# Patient Record
Sex: Male | Born: 1975 | Race: White | Hispanic: No | Marital: Married | Smoking: Former smoker
Health system: Southern US, Community
[De-identification: ages and names within clinical notes are randomized; demographics above are authoritative.]

## PROBLEM LIST (undated history)

## (undated) DIAGNOSIS — S060X9A Concussion with loss of consciousness of unspecified duration, initial encounter: Secondary | ICD-10-CM

## (undated) DIAGNOSIS — S060XAA Concussion with loss of consciousness status unknown, initial encounter: Secondary | ICD-10-CM

## (undated) DIAGNOSIS — S069X9A Unspecified intracranial injury with loss of consciousness of unspecified duration, initial encounter: Secondary | ICD-10-CM

## (undated) DIAGNOSIS — M5126 Other intervertebral disc displacement, lumbar region: Secondary | ICD-10-CM

## (undated) DIAGNOSIS — S069XAA Unspecified intracranial injury with loss of consciousness status unknown, initial encounter: Secondary | ICD-10-CM

## (undated) DIAGNOSIS — F319 Bipolar disorder, unspecified: Secondary | ICD-10-CM

## (undated) DIAGNOSIS — F4310 Post-traumatic stress disorder, unspecified: Secondary | ICD-10-CM

## (undated) DIAGNOSIS — F509 Eating disorder, unspecified: Secondary | ICD-10-CM

## (undated) HISTORY — PX: OTHER SURGICAL HISTORY: SHX169

---

## 2014-09-17 ENCOUNTER — Emergency Department (HOSPITAL_BASED_OUTPATIENT_CLINIC_OR_DEPARTMENT_OTHER): Payer: BC Managed Care – PPO

## 2014-09-17 ENCOUNTER — Emergency Department (HOSPITAL_BASED_OUTPATIENT_CLINIC_OR_DEPARTMENT_OTHER)
Admission: EM | Admit: 2014-09-17 | Discharge: 2014-09-17 | Disposition: A | Discharge status: Home / Self Care | Payer: BC Managed Care – PPO | Attending: Emergency Medicine | Admitting: Emergency Medicine

## 2014-09-17 ENCOUNTER — Encounter (HOSPITAL_BASED_OUTPATIENT_CLINIC_OR_DEPARTMENT_OTHER): Payer: Self-pay

## 2014-09-17 DIAGNOSIS — K7010 Alcoholic hepatitis without ascites: Secondary | ICD-10-CM | POA: Insufficient documentation

## 2014-09-17 DIAGNOSIS — R21 Rash and other nonspecific skin eruption: Secondary | ICD-10-CM | POA: Diagnosis not present

## 2014-09-17 DIAGNOSIS — R740 Nonspecific elevation of levels of transaminase and lactic acid dehydrogenase [LDH]: Secondary | ICD-10-CM | POA: Insufficient documentation

## 2014-09-17 DIAGNOSIS — Z79899 Other long term (current) drug therapy: Secondary | ICD-10-CM | POA: Diagnosis not present

## 2014-09-17 DIAGNOSIS — Z880 Allergy status to penicillin: Secondary | ICD-10-CM | POA: Diagnosis not present

## 2014-09-17 DIAGNOSIS — Z87891 Personal history of nicotine dependence: Secondary | ICD-10-CM | POA: Diagnosis not present

## 2014-09-17 DIAGNOSIS — F319 Bipolar disorder, unspecified: Secondary | ICD-10-CM | POA: Insufficient documentation

## 2014-09-17 DIAGNOSIS — Z8739 Personal history of other diseases of the musculoskeletal system and connective tissue: Secondary | ICD-10-CM | POA: Insufficient documentation

## 2014-09-17 DIAGNOSIS — R1110 Vomiting, unspecified: Secondary | ICD-10-CM

## 2014-09-17 DIAGNOSIS — R197 Diarrhea, unspecified: Secondary | ICD-10-CM

## 2014-09-17 DIAGNOSIS — R7401 Elevation of levels of liver transaminase levels: Secondary | ICD-10-CM

## 2014-09-17 HISTORY — DX: Concussion with loss of consciousness status unknown, initial encounter: S06.0XAA

## 2014-09-17 HISTORY — DX: Post-traumatic stress disorder, unspecified: F43.10

## 2014-09-17 HISTORY — DX: Other intervertebral disc displacement, lumbar region: M51.26

## 2014-09-17 HISTORY — DX: Bipolar disorder, unspecified: F31.9

## 2014-09-17 HISTORY — DX: Concussion with loss of consciousness of unspecified duration, initial encounter: S06.0X9A

## 2014-09-17 HISTORY — DX: Eating disorder, unspecified: F50.9

## 2014-09-17 LAB — CBC WITH DIFFERENTIAL/PLATELET
Basophils Absolute: 0.1 10*3/uL (ref 0.0–0.1)
Basophils Relative: 1 % (ref 0–1)
Eosinophils Absolute: 0.1 10*3/uL (ref 0.0–0.7)
Eosinophils Relative: 1 % (ref 0–5)
HCT: 50.2 % (ref 39.0–52.0)
HEMOGLOBIN: 17.5 g/dL — AB (ref 13.0–17.0)
LYMPHS ABS: 1.1 10*3/uL (ref 0.7–4.0)
LYMPHS PCT: 22 % (ref 12–46)
MCH: 30.5 pg (ref 26.0–34.0)
MCHC: 34.9 g/dL (ref 30.0–36.0)
MCV: 87.6 fL (ref 78.0–100.0)
MONOS PCT: 16 % — AB (ref 3–12)
Monocytes Absolute: 0.8 10*3/uL (ref 0.1–1.0)
NEUTROS PCT: 60 % (ref 43–77)
Neutro Abs: 3.1 10*3/uL (ref 1.7–7.7)
PLATELETS: 238 10*3/uL (ref 150–400)
RBC: 5.73 MIL/uL (ref 4.22–5.81)
RDW: 12.7 % (ref 11.5–15.5)
WBC: 5.1 10*3/uL (ref 4.0–10.5)

## 2014-09-17 LAB — COMPREHENSIVE METABOLIC PANEL
ALK PHOS: 86 U/L (ref 39–117)
ALT: 348 U/L — ABNORMAL HIGH (ref 0–53)
ANION GAP: 16 — AB (ref 5–15)
AST: 513 U/L — ABNORMAL HIGH (ref 0–37)
Albumin: 4.3 g/dL (ref 3.5–5.2)
BUN: 16 mg/dL (ref 6–23)
CO2: 26 mEq/L (ref 19–32)
Calcium: 9.3 mg/dL (ref 8.4–10.5)
Chloride: 93 mEq/L — ABNORMAL LOW (ref 96–112)
Creatinine, Ser: 1 mg/dL (ref 0.50–1.35)
GFR calc non Af Amer: >90 mL/min (ref >90)
GLUCOSE: 104 mg/dL — AB (ref 70–99)
Potassium: 4.1 mEq/L (ref 3.7–5.3)
Sodium: 135 mEq/L — ABNORMAL LOW (ref 137–147)
Total Bilirubin: 0.7 mg/dL (ref 0.3–1.2)
Total Protein: 7.7 g/dL (ref 6.0–8.3)

## 2014-09-17 LAB — PROTIME-INR
INR: 1.06 (ref 0.00–1.49)
Prothrombin Time: 13.8 seconds (ref 11.6–15.2)

## 2014-09-17 MED ORDER — SODIUM CHLORIDE 0.9 % IV BOLUS (SEPSIS)
1000.0000 mL | Freq: Once | INTRAVENOUS | Status: AC
  Administered 2014-09-17: 1000 mL via INTRAVENOUS

## 2014-09-17 MED ORDER — ONDANSETRON HCL 4 MG PO TABS: 4.0000 mg | ORAL_TABLET | Freq: Four times a day (QID) | ORAL | Status: AC

## 2014-09-17 MED ORDER — DICYCLOMINE HCL 10 MG/ML IM SOLN
20.0000 mg | Freq: Once | INTRAMUSCULAR | Status: AC
  Administered 2014-09-17: 20 mg via INTRAMUSCULAR
  Filled 2014-09-17: qty 2

## 2014-09-17 MED ORDER — ONDANSETRON HCL 4 MG/2ML IJ SOLN
4.0000 mg | Freq: Once | INTRAMUSCULAR | Status: AC
  Administered 2014-09-17: 4 mg via INTRAVENOUS
  Filled 2014-09-17: qty 2

## 2014-09-17 NOTE — ED Provider Notes (Signed)
4:55 PM patient is able to drink without vomiting and feels ready to go home. He appears in no distress. He is instructed to get his blood pressure recheck in 3 weeks. Results for orders placed or performed during the hospital encounter of 09/17/14  CBC with Differential  Result Value Ref Range   WBC 5.1 4.0 - 10.5 K/uL   RBC 5.73 4.22 - 5.81 MIL/uL   Hemoglobin 17.5 (H) 13.0 - 17.0 g/dL   HCT 40.950.2 81.139.0 - 91.452.0 %   MCV 87.6 78.0 - 100.0 fL   MCH 30.5 26.0 - 34.0 pg   MCHC 34.9 30.0 - 36.0 g/dL   RDW 78.212.7 95.611.5 - 21.315.5 %   Platelets 238 150 - 400 K/uL   Neutrophils Relative % 60 43 - 77 %   Neutro Abs 3.1 1.7 - 7.7 K/uL   Lymphocytes Relative 22 12 - 46 %   Lymphs Abs 1.1 0.7 - 4.0 K/uL   Monocytes Relative 16 (H) 3 - 12 %   Monocytes Absolute 0.8 0.1 - 1.0 K/uL   Eosinophils Relative 1 0 - 5 %   Eosinophils Absolute 0.1 0.0 - 0.7 K/uL   Basophils Relative 1 0 - 1 %   Basophils Absolute 0.1 0.0 - 0.1 K/uL  Comprehensive metabolic panel  Result Value Ref Range   Sodium 135 (L) 137 - 147 mEq/L   Potassium 4.1 3.7 - 5.3 mEq/L   Chloride 93 (L) 96 - 112 mEq/L   CO2 26 19 - 32 mEq/L   Glucose, Bld 104 (H) 70 - 99 mg/dL   BUN 16 6 - 23 mg/dL   Creatinine, Ser 0.861.00 0.50 - 1.35 mg/dL   Calcium 9.3 8.4 - 57.810.5 mg/dL   Total Protein 7.7 6.0 - 8.3 g/dL   Albumin 4.3 3.5 - 5.2 g/dL   AST 469513 (H) 0 - 37 U/L   ALT 348 (H) 0 - 53 U/L   Alkaline Phosphatase 86 39 - 117 U/L   Total Bilirubin 0.7 0.3 - 1.2 mg/dL   GFR calc non Af Amer >90 >90 mL/min   GFR calc Af Amer >90 >90 mL/min   Anion gap 16 (H) 5 - 15  Protime-INR  Result Value Ref Range   Prothrombin Time 13.8 11.6 - 15.2 seconds   INR 1.06 0.00 - 1.49   [place] Abdomen Complete  09/17/2014   CLINICAL DATA:  Elevated LFTs, transaminitis, nausea, vomiting, diarrhea for 4 days.  EXAM: ULTRASOUND ABDOMEN COMPLETE  COMPARISON:  None.  FINDINGS: Gallbladder: No gallstones or wall thickening visualized. No sonographic Murphy sign noted.   Common bile duct: Diameter: 3 mm  Liver: Mild coarse increased echogenicity compatible with hepatic steatosis. No intrahepatic biliary dilatation or definite focal abnormality. Patent portal vein with normal hepatopetal flow.  IVC: Limited visualization.  No gross abnormality  Pancreas: Visualized portion unremarkable.  Spleen: Size and appearance within normal limits.  Right Kidney: Length: 12.6 cm. Echogenicity within normal limits. No mass or hydronephrosis visualized.  Left Kidney: Length: 12.6 cm. Echogenicity within normal limits. No mass or hydronephrosis visualized.  Abdominal aorta: No aneurysm visualized.  Other findings: None.  IMPRESSION: No acute finding by abdominal ultrasound.  Mild hepatic steatosis or fatty infiltration of the liver.   Electronically Signed   By: Ruel Favorsrevor  Shick M.D.   On: 09/17/2014 15:26     Larry SouSam Cort Dragoo, MD 09/17/14 1700

## 2014-09-17 NOTE — ED Provider Notes (Signed)
CSN: [id number]     Arrival date & time 09/17/14  1205 History   First MD Initiated Contact with Patient 09/17/14 1221     Chief Complaint  Patient presents with  . Vomiting     (Consider location/radiation/quality/duration/timing/severity/associated sxs/prior Treatment) HPI Comments: Patient reports nausea, vomiting, diarrhea. Symptoms have been present for the last 5 days. Patient having difficulty holding anything down. He is concerned because he has not been able to take his bipolar medicine because of the symptoms. Patient denies fever. He has not had any abdominal pain. No chest pain, cough, shortness of breath. Patient denies hematemesis, melena, rectal bleeding. He noticed a rash on his forehead today. It is not itchy or painful. No drainage. Patient does report that several family members have had mild GI symptoms this week.  Patient does have a history of alcoholism. He reports that he does not drink daily, but he does admit that several days ago he had an episode of binge drinking.   Past Medical History  Diagnosis Date  . Lumbar herniated disc   . Bipolar 1 disorder   . Eating disorder   . Concussion   . PTSD (post-traumatic stress disorder)    History reviewed. No pertinent past surgical history. No family history on file. History  Substance Use Topics  . Smoking status: Former Games developermoker  . Smokeless tobacco: Not on file  . Alcohol Use: Yes    Review of Systems  Gastrointestinal: Positive for nausea, vomiting and diarrhea. Negative for abdominal pain.  Skin: Positive for rash.  All other systems reviewed and are negative.     Allergies  Flumist and Penicillins  Home Medications   Prior to Admission medications   Medication Sig Start Date End Date Taking? Authorizing Provider  asenapine (SAPHRIS) 5 MG SUBL 24 hr tablet Place 5 mg under the tongue 2 (two) times daily.   Yes Historical Provider, MD  gabapentin (NEURONTIN) 800 MG tablet Take 800 mg by mouth 3  (three) times daily.   Yes Historical Provider, MD  lithium carbonate 300 MG capsule Take 300 mg by mouth 2 (two) times daily with a meal.   Yes Historical Provider, MD   BP 155/91 mmHg  Pulse 88  Temp(Src) 98.6 F (37 C) (Oral)  Resp 18  Ht 6' (1.829 m)  Wt 280 lb (127.007 kg)  BMI 37.97 kg/m2  SpO2 98% Physical Exam  Constitutional: He is oriented to person, place, and time. He appears well-developed and well-nourished. No distress.  HENT:  Head: Normocephalic and atraumatic.  Right Ear: Hearing normal.  Left Ear: Hearing normal.  Nose: Nose normal.  Mouth/Throat: Oropharynx is clear and moist and mucous membranes are normal.  Eyes: Conjunctivae and EOM are normal. Pupils are equal, round, and reactive to light.  Neck: Normal range of motion. Neck supple.  Cardiovascular: Regular rhythm, S1 normal and S2 normal.  Exam reveals no gallop and no friction rub.   No murmur heard. Pulmonary/Chest: Effort normal and breath sounds normal. No respiratory distress. He exhibits no tenderness.  Abdominal: Soft. Normal appearance and bowel sounds are normal. There is no hepatosplenomegaly. There is no tenderness. There is no rebound, no guarding, no tenderness at McBurney's point and negative Murphy's sign. No hernia.  Musculoskeletal: Normal range of motion.  Neurological: He is alert and oriented to person, place, and time. He has normal strength. No cranial nerve deficit or sensory deficit. Coordination normal. GCS eye subscore is 4. GCS verbal subscore is 5. GCS motor subscore  is 6.  Skin: Skin is warm, dry and intact. Rash (maculo-papular on forehead) noted. No cyanosis.  Psychiatric: He has a normal mood and affect. His speech is normal and behavior is normal. Thought content normal.  Nursing note and vitals reviewed.   ED Course  Procedures (including critical care time) Labs Review Labs Reviewed  CBC WITH DIFFERENTIAL - Abnormal; Notable for the following:    Hemoglobin 17.5 (*)     Monocytes Relative 16 (*)    All other components within normal limits  COMPREHENSIVE METABOLIC PANEL - Abnormal; Notable for the following:    Sodium 135 (*)    Chloride 93 (*)    Glucose, Bld 104 (*)    AST 513 (*)    ALT 348 (*)    Anion gap 16 (*)    All other components within normal limits  HEPATITIS PANEL, ACUTE  PROTIME-INR    Imaging Review [place] Abdomen Complete  09/17/2014   CLINICAL DATA:  Elevated LFTs, transaminitis, nausea, vomiting, diarrhea for 4 days.  EXAM: ULTRASOUND ABDOMEN COMPLETE  COMPARISON:  None.  FINDINGS: Gallbladder: No gallstones or wall thickening visualized. No sonographic Murphy sign noted.  Common bile duct: Diameter: 3 mm  Liver: Mild coarse increased echogenicity compatible with hepatic steatosis. No intrahepatic biliary dilatation or definite focal abnormality. Patent portal vein with normal hepatopetal flow.  IVC: Limited visualization.  No gross abnormality  Pancreas: Visualized portion unremarkable.  Spleen: Size and appearance within normal limits.  Right Kidney: Length: 12.6 cm. Echogenicity within normal limits. No mass or hydronephrosis visualized.  Left Kidney: Length: 12.6 cm. Echogenicity within normal limits. No mass or hydronephrosis visualized.  Abdominal aorta: No aneurysm visualized.  Other findings: None.  IMPRESSION: No acute finding by abdominal ultrasound.  Mild hepatic steatosis or fatty infiltration of the liver.   Electronically Signed   By: Ruel Favorsrevor  Shick M.D.   On: 09/17/2014 15:26     EKG Interpretation None      MDM   Final diagnoses:  Transaminitis  Acute vomiting  Diarrhea  Hepatitis, alcoholic, acute    Patient presents to the ER for evaluation of nausea, vomiting and diarrhea. Patient reports that symptoms have been ongoing for 5 days. He does report that he has had some family contacts with similar symptoms. Patient had an IV established and was administered IV fluids and antiemetics with some improvement. He is not  expressing any significant abdominal pain and abdominal exam is benign.  Recent blood work, however, does show transaminitis. Patient admits to binge drinking several days ago. He is likely experiencing an acute alcoholic hepatitis. An ultrasound was performed and shows no abnormalities. Patient has normal bilirubin.  Discussed with Dr. Elnoria HowardHung, gastroenterology. He recommends checking a pro time. If the PT is close to normal, there is no significant liver failure and patient can be discharged with follow-up. He will see the patient in the office. Patient will have hepatitis panel sent as well.    Gilda Creasehristopher J. Tevita Gomer, MD 09/17/14 [id number]

## 2014-09-17 NOTE — Discharge Instructions (Signed)
Avoid alcohol. Get your blood pressure rechecked within the next 3 weeks. Today's was elevated at 155/91. Call Dr.Hung's office to schedule the next available appointment

## 2014-09-17 NOTE — ED Notes (Signed)
Pt c/o increased cramping. MD made aware. New orders received

## 2014-09-17 NOTE — ED Notes (Signed)
C/o n/v/d x 5 days-rash to face x today

## 2014-09-18 LAB — HEPATITIS PANEL, ACUTE
HCV Ab: NEGATIVE
Hep A IgM: NONREACTIVE
Hep B C IgM: NONREACTIVE
Hepatitis B Surface Ag: NEGATIVE

## 2015-05-23 ENCOUNTER — Encounter (HOSPITAL_BASED_OUTPATIENT_CLINIC_OR_DEPARTMENT_OTHER): Payer: Self-pay | Admitting: *Deleted

## 2015-05-23 ENCOUNTER — Emergency Department (HOSPITAL_BASED_OUTPATIENT_CLINIC_OR_DEPARTMENT_OTHER)
Admission: EM | Admit: 2015-05-23 | Discharge: 2015-05-23 | Disposition: A | Discharge status: Home / Self Care | Payer: 59 | Attending: Emergency Medicine | Admitting: Emergency Medicine

## 2015-05-23 DIAGNOSIS — F4310 Post-traumatic stress disorder, unspecified: Secondary | ICD-10-CM | POA: Diagnosis not present

## 2015-05-23 DIAGNOSIS — T63444A Toxic effect of venom of bees, undetermined, initial encounter: Secondary | ICD-10-CM | POA: Diagnosis not present

## 2015-05-23 DIAGNOSIS — Z87891 Personal history of nicotine dependence: Secondary | ICD-10-CM | POA: Insufficient documentation

## 2015-05-23 DIAGNOSIS — Y998 Other external cause status: Secondary | ICD-10-CM | POA: Diagnosis not present

## 2015-05-23 DIAGNOSIS — Y9289 Other specified places as the place of occurrence of the external cause: Secondary | ICD-10-CM | POA: Insufficient documentation

## 2015-05-23 DIAGNOSIS — Z880 Allergy status to penicillin: Secondary | ICD-10-CM | POA: Insufficient documentation

## 2015-05-23 DIAGNOSIS — Z79899 Other long term (current) drug therapy: Secondary | ICD-10-CM | POA: Diagnosis not present

## 2015-05-23 DIAGNOSIS — Z8739 Personal history of other diseases of the musculoskeletal system and connective tissue: Secondary | ICD-10-CM | POA: Insufficient documentation

## 2015-05-23 DIAGNOSIS — F319 Bipolar disorder, unspecified: Secondary | ICD-10-CM | POA: Insufficient documentation

## 2015-05-23 DIAGNOSIS — Y9389 Activity, other specified: Secondary | ICD-10-CM | POA: Diagnosis not present

## 2015-05-23 MED ORDER — DIPHENHYDRAMINE HCL 50 MG/ML IJ SOLN
25.0000 mg | Freq: Once | INTRAMUSCULAR | Status: AC
  Administered 2015-05-23: 25 mg via INTRAVENOUS
  Filled 2015-05-23: qty 1

## 2015-05-23 MED ORDER — PREDNISONE 10 MG PO TABS: ORAL_TABLET | ORAL | Status: DC

## 2015-05-23 MED ORDER — SODIUM CHLORIDE 0.9 % IV SOLN
Freq: Once | INTRAVENOUS | Status: AC
  Administered 2015-05-23: 13:00:00 via INTRAVENOUS

## 2015-05-23 MED ORDER — FAMOTIDINE IN NACL 20-0.9 MG/50ML-% IV SOLN
20.0000 mg | Freq: Once | INTRAVENOUS | Status: AC
  Administered 2015-05-23: 20 mg via INTRAVENOUS
  Filled 2015-05-23: qty 50

## 2015-05-23 MED ORDER — HYDROXYZINE HCL 25 MG PO TABS: 25.0000 mg | ORAL_TABLET | Freq: Four times a day (QID) | ORAL | Status: AC

## 2015-05-23 MED ORDER — METHYLPREDNISOLONE SODIUM SUCC 125 MG IJ SOLR
125.0000 mg | Freq: Once | INTRAMUSCULAR | Status: AC
  Administered 2015-05-23: 125 mg via INTRAVENOUS
  Filled 2015-05-23: qty 2

## 2015-05-23 NOTE — Discharge Instructions (Signed)

## 2015-05-23 NOTE — ED Notes (Signed)
Pt reports 15-30 wasp stings 1 week ago. States has been taking benadryl and topical creams- states sx has worsened the last 2 days

## 2015-05-23 NOTE — ED Provider Notes (Signed)
CSN: [id number]     Arrival date & time 05/23/15  1226 History   First MD Initiated Contact with Patient 05/23/15 1245     Chief Complaint  Patient presents with  . Insect Bite     (Consider location/radiation/quality/duration/timing/severity/associated sxs/prior Treatment) Patient is a 39 y.o. male presenting with allergic reaction. The history is provided by the patient. No language interpreter was used.  Allergic Reaction Presenting symptoms: itching, rash and swelling   Swelling:    Location:  Full body   Onset quality:  Gradual   Duration:  1 week   Timing:  Constant   Progression:  Worsening   Chronicity:  New Severity:  Moderate Prior allergic episodes:  Insect allergies Context: insect bite/sting   Context: no poison ivy   Relieved by:  Nothing Worsened by:  Nothing tried Ineffective treatments:  Antihistamines Pt reports he was stung by wasp a week ago.  Pt has large areas of swelling multiple sites  (softball size) Pt has multiple scratches  Past Medical History  Diagnosis Date  . Lumbar herniated disc   . Bipolar 1 disorder   . Eating disorder   . Concussion   . PTSD (post-traumatic stress disorder)    No past surgical history on file. No family history on file. History  Substance Use Topics  . Smoking status: Former Games developer  . Smokeless tobacco: Former Neurosurgeon  . Alcohol Use: Yes     Comment: denies current  use    Review of Systems  Skin: Positive for itching and rash.  All other systems reviewed and are negative.     Allergies  Flumist and Penicillins  Home Medications   Prior to Admission medications   Medication Sig Start Date End Date Taking? Authorizing Provider  clonazePAM (KLONOPIN) 1 MG tablet Take 1 mg by mouth 3 (three) times daily.   Yes Historical Provider, MD  asenapine (SAPHRIS) 5 MG SUBL 24 hr tablet Place 5 mg under the tongue 2 (two) times daily.    Historical Provider, MD  gabapentin (NEURONTIN) 800 MG tablet Take 800 mg by  mouth 3 (three) times daily.    Historical Provider, MD  lithium carbonate 300 MG capsule Take 300 mg by mouth 2 (two) times daily with a meal.    Historical Provider, MD  ondansetron (ZOFRAN) 4 MG tablet Take 1 tablet (4 mg total) by mouth every 6 (six) hours. 09/17/14   Gilda Crease, MD   BP 124/74 mmHg  Pulse 76  Temp(Src) 98.3 F (36.8 C) (Oral)  Resp 20  Ht 6' (1.829 m)  Wt 280 lb (127.007 kg)  BMI 37.97 kg/m2  SpO2 98% Physical Exam  Constitutional: He is oriented to person, place, and time. He appears well-developed and well-nourished.  HENT:  Head: Normocephalic and atraumatic.  Right Ear: External ear normal.  Mouth/Throat: Oropharynx is clear and moist.  Eyes: Conjunctivae and EOM are normal. Pupils are equal, round, and reactive to light.  Neck: Normal range of motion.  Cardiovascular: Normal rate, regular rhythm and normal heart sounds.   Pulmonary/Chest: Effort normal and breath sounds normal.  Abdominal: Soft. He exhibits no distension.  Musculoskeletal: Normal range of motion.  Neurological: He is alert and oriented to person, place, and time.  Skin: There is erythema.  Grapefruit size areas of erythema.   Psychiatric: He has a normal mood and affect.  Nursing note and vitals reviewed.   ED Course  Procedures (including critical care time) Labs Review Labs Reviewed - No  data to display  Imaging Review No results found.   EKG Interpretation None      MDM  Pt given solumedrol iv, benadryl 1v and pepcid.    Pt does not want prednisone po.  It causes him anxiety.   Pt given Rx for atarax.    Final diagnoses:  Bee sting, undetermined intent, initial encounter    Follow up with your Phyisicain for recheck.     Leslie K Sofia,Elson Areas/16 1719  Lonia Skinner [place], PA-C 05/23/15 1720  Nelva Nay, MD 05/24/15 [phone]

## 2015-09-20 ENCOUNTER — Emergency Department (HOSPITAL_BASED_OUTPATIENT_CLINIC_OR_DEPARTMENT_OTHER): Payer: 59

## 2015-09-20 ENCOUNTER — Emergency Department (HOSPITAL_BASED_OUTPATIENT_CLINIC_OR_DEPARTMENT_OTHER)
Admission: EM | Admit: 2015-09-20 | Discharge: 2015-09-21 | Disposition: A | Discharge status: Home / Self Care | Payer: 59 | Attending: Emergency Medicine | Admitting: Emergency Medicine

## 2015-09-20 ENCOUNTER — Encounter (HOSPITAL_BASED_OUTPATIENT_CLINIC_OR_DEPARTMENT_OTHER): Payer: Self-pay | Admitting: *Deleted

## 2015-09-20 DIAGNOSIS — Z79899 Other long term (current) drug therapy: Secondary | ICD-10-CM | POA: Insufficient documentation

## 2015-09-20 DIAGNOSIS — Z87891 Personal history of nicotine dependence: Secondary | ICD-10-CM | POA: Diagnosis not present

## 2015-09-20 DIAGNOSIS — S0101XA Laceration without foreign body of scalp, initial encounter: Secondary | ICD-10-CM | POA: Insufficient documentation

## 2015-09-20 DIAGNOSIS — Z87820 Personal history of traumatic brain injury: Secondary | ICD-10-CM | POA: Diagnosis not present

## 2015-09-20 DIAGNOSIS — S0990XA Unspecified injury of head, initial encounter: Secondary | ICD-10-CM

## 2015-09-20 DIAGNOSIS — Y92009 Unspecified place in unspecified non-institutional (private) residence as the place of occurrence of the external cause: Secondary | ICD-10-CM | POA: Insufficient documentation

## 2015-09-20 DIAGNOSIS — F4310 Post-traumatic stress disorder, unspecified: Secondary | ICD-10-CM | POA: Insufficient documentation

## 2015-09-20 DIAGNOSIS — Y9389 Activity, other specified: Secondary | ICD-10-CM | POA: Insufficient documentation

## 2015-09-20 DIAGNOSIS — F319 Bipolar disorder, unspecified: Secondary | ICD-10-CM | POA: Insufficient documentation

## 2015-09-20 DIAGNOSIS — Y998 Other external cause status: Secondary | ICD-10-CM | POA: Insufficient documentation

## 2015-09-20 DIAGNOSIS — Z7952 Long term (current) use of systemic steroids: Secondary | ICD-10-CM | POA: Insufficient documentation

## 2015-09-20 DIAGNOSIS — Z880 Allergy status to penicillin: Secondary | ICD-10-CM | POA: Insufficient documentation

## 2015-09-20 DIAGNOSIS — Z8739 Personal history of other diseases of the musculoskeletal system and connective tissue: Secondary | ICD-10-CM | POA: Diagnosis not present

## 2015-09-20 HISTORY — DX: Unspecified intracranial injury with loss of consciousness status unknown, initial encounter: S06.9XAA

## 2015-09-20 HISTORY — DX: Unspecified intracranial injury with loss of consciousness of unspecified duration, initial encounter: S06.9X9A

## 2015-09-20 MED ORDER — LIDOCAINE-EPINEPHRINE (PF) 2 %-1:200000 IJ SOLN
10.0000 mL | Freq: Once | INTRAMUSCULAR | Status: AC
  Administered 2015-09-21: 10 mL
  Filled 2015-09-20: qty 10
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
## 2015-09-21 MED ORDER — HYDROCODONE-ACETAMINOPHEN 5-325 MG PO TABS
1.0000 | ORAL_TABLET | Freq: Once | ORAL | Status: AC
  Administered 2015-09-21: 1 via ORAL
  Filled 2015-09-21: qty 1

## 2015-09-21 NOTE — Discharge Instructions (Signed)
Please read and follow all provided instructions.  Your diagnoses today include:  1. Head injury, initial encounter   2. Scalp laceration, initial encounter    Tests performed today include:  CT scan of your head that did not show any serious injury.  Vital signs. See below for your results today.   Medications prescribed:   None  Take any prescribed medications only as directed.  Home care instructions:  Follow any educational materials contained in this packet.  BE VERY CAREFUL not to take multiple medicines containing Tylenol (also called acetaminophen). Doing so can lead to an overdose which can damage your liver and cause liver failure and possibly death.   Follow-up instructions: Please follow-up with your primary care provider in the next 5 days for further evaluation of your symptoms and removal of your stitches and staples.   Return instructions:  SEEK IMMEDIATE MEDICAL ATTENTION IF:  There is confusion or drowsiness (although children frequently become drowsy after injury).   You cannot awaken the injured person.   You have more than one episode of vomiting.   You notice dizziness or unsteadiness which is getting worse, or inability to walk.   You have convulsions or unconsciousness.   You experience severe, persistent headaches not relieved by Tylenol.  You cannot use arms or legs normally.   There are changes in pupil sizes. (This is the black center in the colored part of the eye)   There is clear or bloody discharge from the nose or ears.   You have change in speech, vision, swallowing, or understanding.   Localized weakness, numbness, tingling, or change in bowel or bladder control.  You have any other emergent concerns.  Additional Information: You have had a head injury which does not appear to require admission at this time.  Your vital signs today were: BP 144/102 mmHg   Pulse 110   Temp(Src) 98.5 F (36.9 C) (Oral)   Resp 18   Ht 5\' 11"   (1.803 m)   Wt 127.007 kg   BMI 39.07 kg/m2   SpO2 99% If your blood pressure (BP) was elevated above 135/85 this visit, please have this repeated by your doctor within one month. --------------

## 2016-05-28 ENCOUNTER — Emergency Department (HOSPITAL_BASED_OUTPATIENT_CLINIC_OR_DEPARTMENT_OTHER)
Admission: EM | Admit: 2016-05-28 | Discharge: 2016-05-28 | Disposition: A | Discharge status: Home / Self Care | Payer: 59 | Attending: Emergency Medicine | Admitting: Emergency Medicine

## 2016-05-28 ENCOUNTER — Encounter (HOSPITAL_BASED_OUTPATIENT_CLINIC_OR_DEPARTMENT_OTHER): Payer: Self-pay | Admitting: Emergency Medicine

## 2016-05-28 DIAGNOSIS — R42 Dizziness and giddiness: Secondary | ICD-10-CM | POA: Diagnosis not present

## 2016-05-28 DIAGNOSIS — R5383 Other fatigue: Secondary | ICD-10-CM | POA: Diagnosis not present

## 2016-05-28 DIAGNOSIS — T782XXA Anaphylactic shock, unspecified, initial encounter: Secondary | ICD-10-CM | POA: Diagnosis not present

## 2016-05-28 DIAGNOSIS — R220 Localized swelling, mass and lump, head: Secondary | ICD-10-CM | POA: Diagnosis not present

## 2016-05-28 DIAGNOSIS — M791 Myalgia: Secondary | ICD-10-CM | POA: Insufficient documentation

## 2016-05-28 DIAGNOSIS — R110 Nausea: Secondary | ICD-10-CM | POA: Diagnosis not present

## 2016-05-28 DIAGNOSIS — Z87891 Personal history of nicotine dependence: Secondary | ICD-10-CM | POA: Insufficient documentation

## 2016-05-28 DIAGNOSIS — R0602 Shortness of breath: Secondary | ICD-10-CM | POA: Diagnosis present

## 2016-05-28 MED ORDER — DIPHENHYDRAMINE HCL 50 MG/ML IJ SOLN
25.0000 mg | Freq: Once | INTRAMUSCULAR | Status: AC
  Administered 2016-05-28: 25 mg via INTRAVENOUS

## 2016-05-28 MED ORDER — SODIUM CHLORIDE 0.9 % IV BOLUS (SEPSIS)
1000.0000 mL | Freq: Once | INTRAVENOUS | Status: AC
  Administered 2016-05-28: 1000 mL via INTRAVENOUS

## 2016-05-28 MED ORDER — METHYLPREDNISOLONE SODIUM SUCC 125 MG IJ SOLR
125.0000 mg | Freq: Once | INTRAMUSCULAR | Status: AC
  Administered 2016-05-28: 125 mg via INTRAVENOUS

## 2016-05-28 MED ORDER — METHYLPREDNISOLONE SODIUM SUCC 125 MG IJ SOLR
INTRAMUSCULAR | Status: AC
  Filled 2016-05-28: qty 2

## 2016-05-28 MED ORDER — DIPHENHYDRAMINE HCL 50 MG/ML IJ SOLN
INTRAMUSCULAR | Status: AC
  Filled 2016-05-28: qty 1

## 2016-05-28 MED ORDER — PREDNISONE 20 MG PO TABS: ORAL_TABLET | ORAL | 0 refills | Status: AC

## 2016-05-28 MED ORDER — FAMOTIDINE IN NACL 20-0.9 MG/50ML-% IV SOLN
20.0000 mg | Freq: Once | INTRAVENOUS | Status: AC
  Administered 2016-05-28: 20 mg via INTRAVENOUS
  Filled 2016-05-28: qty 50

## 2016-05-28 MED ORDER — EPINEPHRINE 0.3 MG/0.3ML IJ SOAJ: 0.3000 mg | Freq: Once | INTRAMUSCULAR | 1 refills | Status: AC | PRN

## 2016-05-28 MED ORDER — EPINEPHRINE 0.3 MG/0.3ML IJ SOAJ
INTRAMUSCULAR | Status: AC
  Administered 2016-05-28: 1 mg
  Filled 2016-05-28: qty 0.3

## 2016-05-28 NOTE — ED Notes (Signed)
Pt is very sleepy. Pt woken up and give gingerale and a snack.

## 2016-05-28 NOTE — ED Provider Notes (Addendum)
[facility]   CSN: [id number] Arrival date & time: 05/28/16  1153  First Provider Contact:  First MD Initiated Contact with Patient 05/28/16 1206        History   Chief Complaint Chief Complaint  Patient presents with  . Allergic Reaction    HPI Larry Robbins is a 40 y.o. male.  Patient presents after being stung by wasp. He was working in his yard in about 10:00, stuck a toll into a big loss nose. He got stung multiple times over his body. He developed shortness of breath. He feels like his face is swollen. He has a diffuse rash and itching. He's feeling very dizzy and nauseated. Per his wife, he was confused and he stumbled and fell striking the right side of his face. There is no loss of consciousness. He has an abrasion to his right face but denies any other injuries. He has a history of bipolar disorder and traumatic brain injury. He is not on anticoagulants.      Past Medical History:  Diagnosis Date  . Bipolar 1 disorder (HCC)   . Concussion   . Eating disorder   . Lumbar herniated disc   . PTSD (post-traumatic stress disorder)   . TBI (traumatic brain injury) (HCC)     There are no active problems to display for this patient.   Past Surgical History:  Procedure Laterality Date  . lymph node removal         Home Medications    Prior to Admission medications   Medication Sig Start Date End Date Taking? Authorizing Provider  asenapine (SAPHRIS) 5 MG SUBL 24 hr tablet Place 5 mg under the tongue 2 (two) times daily.    Historical Provider, MD  clonazePAM (KLONOPIN) 1 MG tablet Take 1 mg by mouth 3 (three) times daily.    Historical Provider, MD  gabapentin (NEURONTIN) 800 MG tablet Take 800 mg by mouth 3 (three) times daily.    Historical Provider, MD  hydrOXYzine (ATARAX/VISTARIL) 25 MG tablet Take 1 tablet (25 mg total) by mouth every 6 (six) hours. 05/23/15   Elson Areas, PA-C  lithium carbonate 300 MG capsule Take 300 mg by  mouth 2 (two) times daily with a meal.    Historical Provider, MD  ondansetron (ZOFRAN) 4 MG tablet Take 1 tablet (4 mg total) by mouth every 6 (six) hours. 09/17/14   Gilda Crease, MD  OXCARBAZEPINE PO Take by mouth.    Historical Provider, MD  predniSONE (DELTASONE) 10 MG tablet 6,5,4,3,2,1 taper 05/23/15   Elson Areas, PA-C    Family History No family history on file.  Social History Social History  Substance Use Topics  . Smoking status: Former Games developer  . Smokeless tobacco: Current User  . Alcohol use Yes     Comment: 1x month     Allergies   Flumist [flu virus vaccine] and Penicillins   Review of Systems Review of Systems  Constitutional: Positive for fatigue. Negative for chills, diaphoresis and fever.  HENT: Positive for facial swelling. Negative for congestion, rhinorrhea and sneezing.   Eyes: Negative.   Respiratory: Negative for cough, chest tightness and shortness of breath.   Cardiovascular: Negative for chest pain and leg swelling.  Gastrointestinal: Positive for nausea. Negative for abdominal pain, blood in stool, diarrhea and vomiting.  Genitourinary: Negative for difficulty urinating, flank pain, frequency and hematuria.  Musculoskeletal: Positive for myalgias. Negative for arthralgias and back pain.  Skin: Positive for  rash and wound.  Neurological: Positive for light-headedness. Negative for dizziness, speech difficulty, weakness, numbness and headaches.     Physical Exam Updated Vital Signs BP 110/80   Pulse 84   Temp 97.5 F (36.4 C) (Oral)   Resp 17   SpO2 95%   Physical Exam  Constitutional: He is oriented to person, place, and time. He appears well-developed and well-nourished. He appears distressed.  Obese  HENT:  Head: Normocephalic and atraumatic.  Eyes: Pupils are equal, round, and reactive to light.  Neck: Normal range of motion. Neck supple.  Cardiovascular: Regular rhythm and normal heart sounds.  Tachycardia present.     Pulmonary/Chest: Effort normal and breath sounds normal. No respiratory distress. He has no wheezes. He has no rales. He exhibits no tenderness.  Abdominal: Soft. Bowel sounds are normal. There is no tenderness. There is no rebound and no guarding.  Musculoskeletal: Normal range of motion. He exhibits no edema.  Lymphadenopathy:    He has no cervical adenopathy.  Neurological: He is alert and oriented to person, place, and time.  Skin: Skin is warm. Capillary refill takes more than 3 seconds. No rash noted. He is diaphoretic. There is pallor.  Cyanotic extremities  Psychiatric: He has a normal mood and affect.     ED Treatments / Results  Labs (all labs ordered are listed, but only abnormal results are displayed) Labs Reviewed - No data to display  EKG  EKG Interpretation None       Radiology No results found.  Procedures Procedures (including critical care time)  Medications Ordered in ED Medications  EPINEPHrine (EPI-PEN) 0.3 mg/0.3 mL injection (1 mg  Given 05/28/16 1206)  methylPREDNISolone sodium succinate (SOLU-MEDROL) 125 mg/2 mL injection 125 mg (125 mg Intravenous Given 05/28/16 1209)  diphenhydrAMINE (BENADRYL) injection 25 mg (25 mg Intravenous Given 05/28/16 1209)  famotidine (PEPCID) IVPB 20 mg premix (0 mg Intravenous Stopped 05/28/16 1258)  sodium chloride 0.9 % bolus 1,000 mL (0 mLs Intravenous Stopped 05/28/16 1257)     Initial Impression / Assessment and Plan / ED Course  I have reviewed the triage vital signs and the nursing notes.  Pertinent labs & imaging results that were available during my care of the patient were reviewed by me and considered in my medical decision making (see chart for details).  Clinical Course  Comment By Time  Patient was very distressed on arrival, he was pale and diaphoretic with cyanotic extremities. He was immediately given epi 0.3 mg IM. An IV was established and he was given Solu-Medrol Benadryl and Pepcid. Please also given 1  L of IV fluids. He was placed on a nonrebreather mask. About 10-15 minutes following the medication and restriction, he feels like he's having some improvement in symptoms. Rolan Bucco, MD 08/05 1230  Pt feeling better.  Sleepy, but SOB improved.  Color has improved Rolan Bucco, MD 08/05 1300    15:46 PT much better, but still needs about one more hour of observation.  Dr. Ranae Palms to reassess.  Will need d/c with epi-pen  CRITICAL CARE Performed by: Ayinde Swim Total critical care time: 60 minutes Critical care time was exclusive of separately billable procedures and treating other patients. Critical care was necessary to treat or prevent imminent or life-threatening deterioration. Critical care was time spent personally by me on the following activities: development of treatment plan with patient and/or surrogate as well as nursing, discussions with consultants, evaluation of patient's response to treatment, examination of patient, obtaining history from  patient or surrogate, ordering and performing treatments and interventions, ordering and review of laboratory studies, ordering and review of radiographic studies, pulse oximetry and re-evaluation of patient's condition.  Final Clinical Impressions(s) / ED Diagnoses   Final diagnoses:  Anaphylaxis, initial encounter    New Prescriptions New Prescriptions   No medications on file     Rolan Bucco, MD 05/28/16 1546    Rolan Bucco, MD 05/28/16 1547

## 2016-05-28 NOTE — ED Triage Notes (Addendum)
Pt stung by multiple wasps all over his body. Pt passed out once at home, has lac to R cheek.

## 2018-10-10 ENCOUNTER — Other Ambulatory Visit: Payer: Self-pay

## 2018-10-10 ENCOUNTER — Emergency Department (HOSPITAL_BASED_OUTPATIENT_CLINIC_OR_DEPARTMENT_OTHER): Payer: No Typology Code available for payment source

## 2018-10-10 ENCOUNTER — Encounter (HOSPITAL_BASED_OUTPATIENT_CLINIC_OR_DEPARTMENT_OTHER): Payer: Self-pay | Admitting: Emergency Medicine

## 2018-10-10 ENCOUNTER — Emergency Department (HOSPITAL_BASED_OUTPATIENT_CLINIC_OR_DEPARTMENT_OTHER)
Admission: EM | Admit: 2018-10-10 | Discharge: 2018-10-10 | Disposition: A | Discharge status: Home / Self Care | Payer: No Typology Code available for payment source | Attending: Emergency Medicine | Admitting: Emergency Medicine

## 2018-10-10 DIAGNOSIS — Z79899 Other long term (current) drug therapy: Secondary | ICD-10-CM | POA: Insufficient documentation

## 2018-10-10 DIAGNOSIS — S99921A Unspecified injury of right foot, initial encounter: Secondary | ICD-10-CM | POA: Diagnosis present

## 2018-10-10 DIAGNOSIS — Z87891 Personal history of nicotine dependence: Secondary | ICD-10-CM | POA: Insufficient documentation

## 2018-10-10 DIAGNOSIS — S90851A Superficial foreign body, right foot, initial encounter: Secondary | ICD-10-CM | POA: Insufficient documentation

## 2018-10-10 DIAGNOSIS — Z0389 Encounter for observation for other suspected diseases and conditions ruled out: Secondary | ICD-10-CM | POA: Insufficient documentation

## 2018-10-10 DIAGNOSIS — Y939 Activity, unspecified: Secondary | ICD-10-CM | POA: Diagnosis not present

## 2018-10-10 DIAGNOSIS — Y92009 Unspecified place in unspecified non-institutional (private) residence as the place of occurrence of the external cause: Secondary | ICD-10-CM | POA: Diagnosis not present

## 2018-10-10 DIAGNOSIS — M79671 Pain in right foot: Secondary | ICD-10-CM

## 2018-10-10 DIAGNOSIS — X58XXXA Exposure to other specified factors, initial encounter: Secondary | ICD-10-CM | POA: Diagnosis not present

## 2018-10-10 DIAGNOSIS — Y999 Unspecified external cause status: Secondary | ICD-10-CM | POA: Insufficient documentation

## 2018-10-10 MED ORDER — LIDOCAINE HCL (PF) 1 % IJ SOLN
5.0000 mL | Freq: Once | INTRAMUSCULAR | Status: AC
  Administered 2018-10-10: 5 mL
  Filled 2018-10-10: qty 5

## 2018-10-10 MED ORDER — SULFAMETHOXAZOLE-TRIMETHOPRIM 800-160 MG PO TABS: 1.0000 | ORAL_TABLET | Freq: Two times a day (BID) | ORAL | 0 refills | Status: AC

## 2018-10-10 NOTE — ED Notes (Signed)
ED Provider at bedside. 

## 2018-10-10 NOTE — ED Provider Notes (Signed)
[facility] EMERGENCY DEPARTMENT Provider Note   CSN: [id number] Arrival date & time: 10/10/18  1014     History   Chief Complaint Chief Complaint  Patient presents with  . Foot Injury    HPI Larry Robbins is a 42 y.o. male.  The history is provided by the patient. No language interpreter was used.  Foot Injury   The incident occurred more than 1 week ago. The incident occurred at home. There was no injury mechanism. The pain is present in the right foot. The pain is moderate. The pain has been constant since onset. It is unknown if a foreign body is present. He has tried nothing for the symptoms. The treatment provided no relief.  Pt reports he thinks he has a foreign body in his foot.  Pt reports he has been walking off balance and fell and hit his shoulder.  Pt complains of pain with moving his shoulder  Past Medical History:  Diagnosis Date  . Bipolar 1 disorder (HCC)   . Concussion   . Eating disorder   . Lumbar herniated disc   . PTSD (post-traumatic stress disorder)   . TBI (traumatic brain injury) (HCC)     There are no active problems to display for this patient.   Past Surgical History:  Procedure Laterality Date  . lymph node removal          Home Medications    Prior to Admission medications   Medication Sig Start Date End Date Taking? Authorizing Provider  asenapine (SAPHRIS) 5 MG SUBL 24 hr tablet Place 5 mg under the tongue 2 (two) times daily.   Yes [provider]  clonazePAM (KLONOPIN) 1 MG tablet Take 1 mg by mouth 3 (three) times daily.   Yes [provider]  gabapentin (NEURONTIN) 800 MG tablet Take 800 mg by mouth 3 (three) times daily.   Yes [provider]  lithium carbonate 300 MG capsule Take 300 mg by mouth 2 (two) times daily with a meal.   Yes [provider]  OXCARBAZEPINE PO Take by mouth.   Yes [provider]  EPINEPHrine 0.3 mg/0.3 mL IJ SOAJ injection Inject 0.3 mLs (0.3  mg total) into the muscle once as needed (Difficulty breathing, facial swelling, dizziness). 05/28/16   Loren Racer, MD  hydrOXYzine (ATARAX/VISTARIL) 25 MG tablet Take 1 tablet (25 mg total) by mouth every 6 (six) hours. 05/23/15   Elson Areas, PA-C  ondansetron (ZOFRAN) 4 MG tablet Take 1 tablet (4 mg total) by mouth every 6 (six) hours. 09/17/14   Gilda Crease, MD  predniSONE (DELTASONE) 20 MG tablet 3 tabs po day one, then 2 po daily x 4 days 05/28/16   Loren Racer, MD  sulfamethoxazole-trimethoprim (BACTRIM DS,SEPTRA DS) 800-160 MG tablet Take 1 tablet by mouth 2 (two) times daily for 7 days. 10/10/18 10/17/18  Elson Areas, PA-C    Family History History reviewed. No pertinent family history.  Social History Social History   Tobacco Use  . Smoking status: Former Games developer  . Smokeless tobacco: Current User  Substance Use Topics  . Alcohol use: Yes    Comment: 1x month  . Drug use: No     Allergies   Flumist [flu virus vaccine] and Penicillins   Review of Systems Review of Systems  All other systems reviewed and are negative.    Physical Exam Updated Vital Signs BP 129/67 (BP Location: Right Arm)   Pulse 82   Temp  98.4 F (36.9 C) (Oral)   Resp 16   Ht 5' (1.524 m)   Wt 136.1 kg   SpO2 97%   BMI 58.59 kg/m   Physical Exam Vitals signs reviewed.  HENT:     Head: Normocephalic.  Neck:     Musculoskeletal: Normal range of motion.  Musculoskeletal: Normal range of motion.     Comments: Raised area foot, looks like a callous,   Skin:    General: Skin is warm.  Neurological:     General: No focal deficit present.     Mental Status: He is alert.  Psychiatric:        Mood and Affect: Mood normal.      ED Treatments / Results  Labs (all labs ordered are listed, but only abnormal results are displayed) Labs Reviewed - No data to display  EKG None  Radiology Dg Shoulder Left  Result Date: 10/10/2018 CLINICAL DATA:  Left  shoulder pain.  Recent falls. EXAM: LEFT SHOULDER - 2+ VIEW COMPARISON:  None. FINDINGS: Left shoulder is located without a fracture. Visualized left ribs are intact. IMPRESSION: Negative. Electronically Signed   By: Richarda Overlie M.D.   On: 10/10/2018 13:06   Dg Foot Complete Right  Result Date: 10/10/2018 CLINICAL DATA:  Knot on bottom of foot. Pain. EXAM: RIGHT FOOT COMPLETE - 3+ VIEW COMPARISON:  None. FINDINGS: There is no evidence of fracture or dislocation. No evidence of arthropathy. Slight plantar spurs. Slight Achilles spur. Soft tissues are unremarkable. IMPRESSION: No acute findings. Electronically Signed   By: Elsie Stain M.D.   On: 10/10/2018 11:24    Procedures .Marland KitchenIncision and Drainage Date/Time: 10/10/2018 6:16 PM Performed by: Elson Areas, PA-C Authorized by: Elson Areas, PA-C   Consent:    Consent obtained:  Verbal   Consent given by:  Patient   Alternatives discussed:  No treatment Location:    Location:  Lower extremity Pre-procedure details:    Skin preparation:  Betadine Procedure type:    Complexity:  Simple Procedure details:    Incision types:  Cruciate   Scalpel blade:  11   Wound management:  Probed and deloculated   Packing materials:  None Post-procedure details:    Patient tolerance of procedure:  Tolerated well, no immediate complications Comments:     Wound explored no obvious foreign body,  I shave down calloused area in order to explore.   I advised pt I do not see a foreign body   (including critical care time)  Medications Ordered in ED Medications  lidocaine (PF) (XYLOCAINE) 1 % injection 5 mL (5 mLs Infiltration Given by Other 10/10/18 1352)     Initial Impression / Assessment and Plan / ED Course  I have reviewed the triage vital signs and the nursing notes.  Pertinent labs & imaging results that were available during my care of the patient were reviewed by me and considered in my medical decision making (see chart for  details).     Dr. Dellia Cloud sounded and there appears to be a foreign body.   Final Clinical Impressions(s) / ED Diagnoses   Final diagnoses:  Right foot pain  Foreign body in right foot, initial encounter    ED Discharge Orders         Ordered    sulfamethoxazole-trimethoprim (BACTRIM DS,SEPTRA DS) 800-160 MG tablet  2 times daily     10/10/18 1408        An After Visit Summary was printed and given  to the patient.    Elson AreasSofia, Naod Sweetland K, [place] JerseyPA-C 10/10/18 1818    Little, Ambrose Finlandachel Morgan, MD 10/11/18 [id number]

## 2018-10-10 NOTE — Discharge Instructions (Addendum)
See your Podiatrist for recheck.  Return if any problems.

## 2018-10-10 NOTE — ED Triage Notes (Signed)
Reports foreign body in right foot x 9 days.

## 2018-10-10 NOTE — ED Notes (Signed)
C/o pain to left shoulder pain.

## 2018-10-11 NOTE — ED Provider Notes (Signed)
Ultrasound ED Soft Tissue Date/Time: 10/11/2018 1:01 PM Performed by: Laurence SpatesLittle, Steven Basso Morgan, MD Authorized by: Laurence SpatesLittle, Lue Sykora Morgan, MD   Procedure details:    Indications: evaluate for foreign body     Transverse view:  Visualized   Longitudinal view:  Visualized   Images: archived   Location:    Location: lower extremity     Side:  Right Findings:     foreign body present Comments:     Small very superficial foreign body      Ambria Mayfield, Ambrose Finlandachel Morgan, MD 10/11/18 1301

## 2019-12-07 ENCOUNTER — Other Ambulatory Visit: Payer: Self-pay

## 2019-12-07 ENCOUNTER — Encounter (HOSPITAL_BASED_OUTPATIENT_CLINIC_OR_DEPARTMENT_OTHER): Payer: Self-pay | Admitting: Emergency Medicine

## 2019-12-07 ENCOUNTER — Emergency Department (HOSPITAL_BASED_OUTPATIENT_CLINIC_OR_DEPARTMENT_OTHER)
Admission: EM | Admit: 2019-12-07 | Discharge: 2019-12-07 | Disposition: A | Discharge status: Home / Self Care | Payer: No Typology Code available for payment source | Attending: Emergency Medicine | Admitting: Emergency Medicine

## 2019-12-07 ENCOUNTER — Emergency Department (HOSPITAL_BASED_OUTPATIENT_CLINIC_OR_DEPARTMENT_OTHER): Payer: No Typology Code available for payment source

## 2019-12-07 DIAGNOSIS — Z87820 Personal history of traumatic brain injury: Secondary | ICD-10-CM | POA: Insufficient documentation

## 2019-12-07 DIAGNOSIS — Z887 Allergy status to serum and vaccine status: Secondary | ICD-10-CM | POA: Diagnosis not present

## 2019-12-07 DIAGNOSIS — Z880 Allergy status to penicillin: Secondary | ICD-10-CM | POA: Insufficient documentation

## 2019-12-07 DIAGNOSIS — Y92410 Unspecified street and highway as the place of occurrence of the external cause: Secondary | ICD-10-CM | POA: Insufficient documentation

## 2019-12-07 DIAGNOSIS — N5082 Scrotal pain: Secondary | ICD-10-CM | POA: Diagnosis not present

## 2019-12-07 DIAGNOSIS — M79644 Pain in right finger(s): Secondary | ICD-10-CM | POA: Insufficient documentation

## 2019-12-07 DIAGNOSIS — Y9389 Activity, other specified: Secondary | ICD-10-CM | POA: Insufficient documentation

## 2019-12-07 DIAGNOSIS — S8391XA Sprain of unspecified site of right knee, initial encounter: Secondary | ICD-10-CM | POA: Diagnosis not present

## 2019-12-07 DIAGNOSIS — Z79899 Other long term (current) drug therapy: Secondary | ICD-10-CM | POA: Insufficient documentation

## 2019-12-07 DIAGNOSIS — S8991XA Unspecified injury of right lower leg, initial encounter: Secondary | ICD-10-CM | POA: Diagnosis present

## 2019-12-07 DIAGNOSIS — W000XXA Fall on same level due to ice and snow, initial encounter: Secondary | ICD-10-CM | POA: Insufficient documentation

## 2019-12-07 DIAGNOSIS — Y999 Unspecified external cause status: Secondary | ICD-10-CM | POA: Diagnosis not present

## 2019-12-07 NOTE — Discharge Instructions (Signed)
As we discussed, your knee x-ray did not show any acute bony abnormalities.  He may still have a muscle injury, ligament injury.  You should wear the knee support and attempt not to bear weight on it.  You can either use cane or crutches.  Follow-up with referred orthopedic doctor.  Additionally, as we discussed, you continue to take your antibiotic as directed.  Follow-up with your urologist regarding your continued scrotal pain and the wound to the right side.  Return to emergency department for any fevers, worsening redness or swelling of the testicles, vomiting or any other worsening or concerning symptoms.

## 2019-12-07 NOTE — ED Provider Notes (Signed)
[facility] EMERGENCY DEPARTMENT Provider Note   CSN: [id number] Arrival date & time: 12/07/19  1534     History Chief Complaint  Patient presents with  . Post-op Problem  . Knee Pain    Larry Robbins is a 44 y.o. male with PMH/o Bipolar, TBI, PTSD who presents for evaluation of two issues. First, patient reports concern about stitches in his right testicle from a vasectomy that occurred approximately 2 weeks ago. He reports that since then he has been having diffuse testicle pain and edema. He followed up with an ED in [place] 2 days ago where he had a negative U/S concerning for torsion. He reports that pain and swelling has not gotten worse, but today his wife noticed that one of his stitches on his right testicle seemed to be missing, which is what prompted his ED visit. He is on bactrim which he states he has been taking. Secondly, he reports right knee and hand pain after a mechanical fall. Patient reports that on his way to get here, there was a tree in the middle of the road and he got out to move it and when he did, he slipped on ice and landed on his right knee and hand. He has been able to ambulate since then but reports pain to the anterior right knee. He also reports pain to the right thumb, index finger. He denies any fevers, difficulty urinating. He has baseline numbness/weakness from his previous TBI.   The history is provided by the patient.       Past Medical History:  Diagnosis Date  . Bipolar 1 disorder ([place])   . Concussion   . Eating disorder   . Lumbar herniated disc   . PTSD (post-traumatic stress disorder)   . TBI (traumatic brain injury) ([place])     There are no problems to display for this patient.   Past Surgical History:  Procedure Laterality Date  . lymph node removal         No family history on file.  Social History   Tobacco Use  . Smoking status: Former Research scientist (life sciences)  . Smokeless tobacco: Current User  Substance Use Topics  .  Alcohol use: Yes    Comment: 1x month  . Drug use: No    Home Medications Prior to Admission medications   Medication Sig Start Date End Date Taking? Authorizing Provider  asenapine (SAPHRIS) 5 MG SUBL 24 hr tablet Place 5 mg under the tongue 2 (two) times daily.    [provider]  clonazePAM (KLONOPIN) 1 MG tablet Take 1 mg by mouth 3 (three) times daily.    [provider]  EPINEPHrine 0.3 mg/0.3 mL IJ SOAJ injection Inject 0.3 mLs (0.3 mg total) into the muscle once as needed (Difficulty breathing, facial swelling, dizziness). 05/28/16   Julianne Rice, MD  gabapentin (NEURONTIN) 800 MG tablet Take 800 mg by mouth 3 (three) times daily.    [provider]  hydrOXYzine (ATARAX/VISTARIL) 25 MG tablet Take 1 tablet (25 mg total) by mouth every 6 (six) hours. 05/23/15   Fransico Meadow, PA-C  lithium carbonate 300 MG capsule Take 300 mg by mouth 2 (two) times daily with a meal.    [provider]  ondansetron (ZOFRAN) 4 MG tablet Take 1 tablet (4 mg total) by mouth every 6 (six) hours. 09/17/14   Orpah Greek, MD  OXCARBAZEPINE PO Take by mouth.    [provider]  predniSONE (DELTASONE) 20 MG tablet  3 tabs po day one, then 2 po daily x 4 days 05/28/16   Loren Racer, MD    Allergies    Flumist [flu virus vaccine] and Penicillins  Review of Systems   Review of Systems  Constitutional: Negative for fever.  Genitourinary: Positive for scrotal swelling.  Musculoskeletal:       Right knee Right hand pain  Neurological: Negative for weakness and numbness.  All other systems reviewed and are negative.   Physical Exam Updated Vital Signs BP (!) 130/94 (BP Location: Right Arm)   Pulse 93   Temp 98.7 F (37.1 C) (Oral)   Resp 20   Ht 5\' 11"  (1.803 m)   Wt (!) 145.2 kg   SpO2 96%   BMI 44.63 kg/m   Physical Exam Vitals and nursing note reviewed. Exam conducted with a chaperone present.  Constitutional:      Appearance:  Normal appearance. He is well-developed.  HENT:     Head: Normocephalic and atraumatic.  Eyes:     General: Lids are normal.     Conjunctiva/sclera: Conjunctivae normal.     Pupils: Pupils are equal, round, and reactive to light.  Cardiovascular:     Pulses: Normal pulses.          Radial pulses are 2+ on the right side and 2+ on the left side.       Dorsalis pedis pulses are 2+ on the right side and 2+ on the left side.  Pulmonary:     Effort: Pulmonary effort is normal.  Abdominal:     Palpations: Abdomen is not rigid.  Genitourinary:    Testes:        Right: Tenderness and swelling present.        Left: Tenderness and swelling present.     Comments: The exam was performed with a chaperone present. Swelling and tenderness noted to bilateral testicles diffusely. Healing surgical incision noted to the lateral left testicle. No surrounding warmth, erythema. Surgical incision on the lateral right testicle that is slightly larger with some mild purulent drainage. No palpable abscess. Normal penis.  Musculoskeletal:        General: Normal range of motion.     Cervical back: Full passive range of motion without pain.     Comments: Tenderness palpation noted to the anterior aspect of the right knee.  Mid range of motion secondary to pain.  There is some overlying ecchymosis.  No deformity or crepitus noted.  No tenderness palpation in his right hip, right tib-fib, ankle.  No tenderness palpation in 2 left lower extremity.  Tenderness palpation of the right thumb, right index finger.  No deformity or crepitus noted.  Full flexion/tension intact with any difficulty.  Skin:    General: Skin is warm and dry.     Capillary Refill: Capillary refill takes less than 2 seconds.  Neurological:     Mental Status: He is alert and oriented to person, place, and time.  Psychiatric:        Speech: Speech normal.     ED Results / Procedures / Treatments   Labs (all labs ordered are listed, but only  abnormal results are displayed) Labs Reviewed - No data to display  EKG None  Radiology DG Knee Complete 4 Views Right  Result Date: 12/07/2019 CLINICAL DATA:  C/o fell today and has pain behind his Rt knee and also in his Rt thumb. EXAM: RIGHT KNEE - COMPLETE 4+ VIEW COMPARISON:  None. FINDINGS: No evidence of  fracture, dislocation, or joint effusion. Mild medial compartment joint space narrowing. Mild tricompartmental spurring. Soft tissues are unremarkable. IMPRESSION: No acute osseous abnormality in the right knee. Electronically Signed   By: Emmaline Kluver M.D.   On: 12/07/2019 16:37   DG Hand Complete Right  Result Date: 12/07/2019 CLINICAL DATA:  C/o fell today and has pain behind his Rt knee and also in his Rt thumb. EXAM: RIGHT HAND - COMPLETE 3+ VIEW COMPARISON:  None. FINDINGS: There is no evidence of fracture or dislocation. There is no evidence of arthropathy or other focal bone abnormality. Soft tissues are unremarkable. IMPRESSION: Negative radiographs of the right hand. Electronically Signed   By: Emmaline Kluver M.D.   On: 12/07/2019 16:35    Procedures Procedures (including critical care time)  Medications Ordered in ED Medications - No data to display  ED Course  I have reviewed the triage vital signs and the nursing notes.  Pertinent labs & imaging results that were available during my care of the patient were reviewed by me and considered in my medical decision making (see chart for details).    MDM Rules/Calculators/A&P                      44 year old male who presents for evaluation of 2 issues.  He is 2 weeks status post vasectomy and states that since then, has had diffuse pain and swelling noted bilateral testicles.  He states no changes in the swelling or pain of his testicles but states that today, his wife noticed that the stitch on the right side was missing.  No fevers.  He is currently on Bactrim.  He has been seen in the ED 2 days ago for similar  symptoms and had a normal ultrasound.  He also reports knee pain after mechanical fall.  He reports that when he was getting here, he slipped on some ice, causing him to twist his knee.  He has been able to ambulate since the incident but does report worsening pain with doing so.  On initial ED arrival, slightly tachycardic.  Blood pressure slightly high.  Likely secondary to pain.  I offered him pain medication though he is driving so I told him it would not have to be anything sedative.  Patient declined pain medication at this time.  We will plan for x-rays of his knee.  Consider sprain versus fracture.  History/exam is not concerning for Fournier's gangrene.  No palpable abscess noted on testicular exam.  The wound appears to be healing and has some mild purulent drainage that was wiped away.  At this time, do not feel that his exam represents acute testicular torsion does not feel that we need to repeat an ultrasound.  Additionally, at this time, given that he is 2 weeks postop and that the wound is healing, no indication for replacing the stitches.  Hand x-ray negative for any acute bony abnormalities.  Knee x-ray negative for any acute bony abnormalities.  I discussed results with patient.  I instructed him to continue take the Bactrim as directed.  Instructed him that he needs to follow-up with his urologist as directed for further evaluation of his issues.  Additionally, I discussed with him that the x-ray is negative but there could still be a musculoskeletal abnormality.  We will plan to put him in a knee brace/support and have him follow-up with orthopedic. Discussed patient with Dr. Donnald Garre who is agreeable to plan. At this time, patient exhibits no  emergent life-threatening condition that require further evaluation in ED or admission. Patient had ample opportunity for questions and discussion. All patient's questions were answered with full understanding. Strict return precautions discussed.  Patient expresses understanding and agreement to plan.   Portions of this note were generated with Scientist, clinical (histocompatibility and immunogenetics). Dictation errors may occur despite best attempts at proofreading   Final Clinical Impression(s) / ED Diagnoses Final diagnoses:  Sprain of right knee, unspecified ligament, initial encounter  Scrotal pain    Rx / DC Orders ED Discharge Orders    None       Rosana Hoes 12/08/19 0003    Arby Barrette, MD 12/08/19 1557

## 2019-12-07 NOTE — ED Triage Notes (Signed)
He had a vasectomy 2 weeks ago. States he has some swelling and the stitches came out and there is now a hole. Pt also states he fell today. C/o R knee pain.

## 2019-12-07 NOTE — ED Provider Notes (Signed)
Medical screening examination/treatment/procedure(s) were conducted as a shared visit with non-physician practitioner(s) and myself.  I personally evaluated the patient during the encounter.    Is concerned about a stitch that has come out from the right testicle after vasectomy.  He also slipped and fell coming to the emergency department injuring his right knee.  The scrotum has a small dehiscence.  There is already granulation tissue on the margins.  No drainage or discharge can be expressed from the area.  Moderate scrotal swelling but appears consistent with post vasectomy.  Does not appear cellulitic.  General tenderness of the right knee with moderate anterior swelling.  Agree with plan of management.   Arby Barrette, MD 12/08/19 1556

## 2021-03-03 IMAGING — DX DG KNEE COMPLETE 4+V*R*
4 series · 4 of 4 positions shown · non-contrast
Comparison: None.

CLINICAL DATA: C/o fell today and has pain behind his Rt knee and
also in his Rt thumb.

EXAM:
RIGHT KNEE - COMPLETE 4+ VIEW

[knee ap]
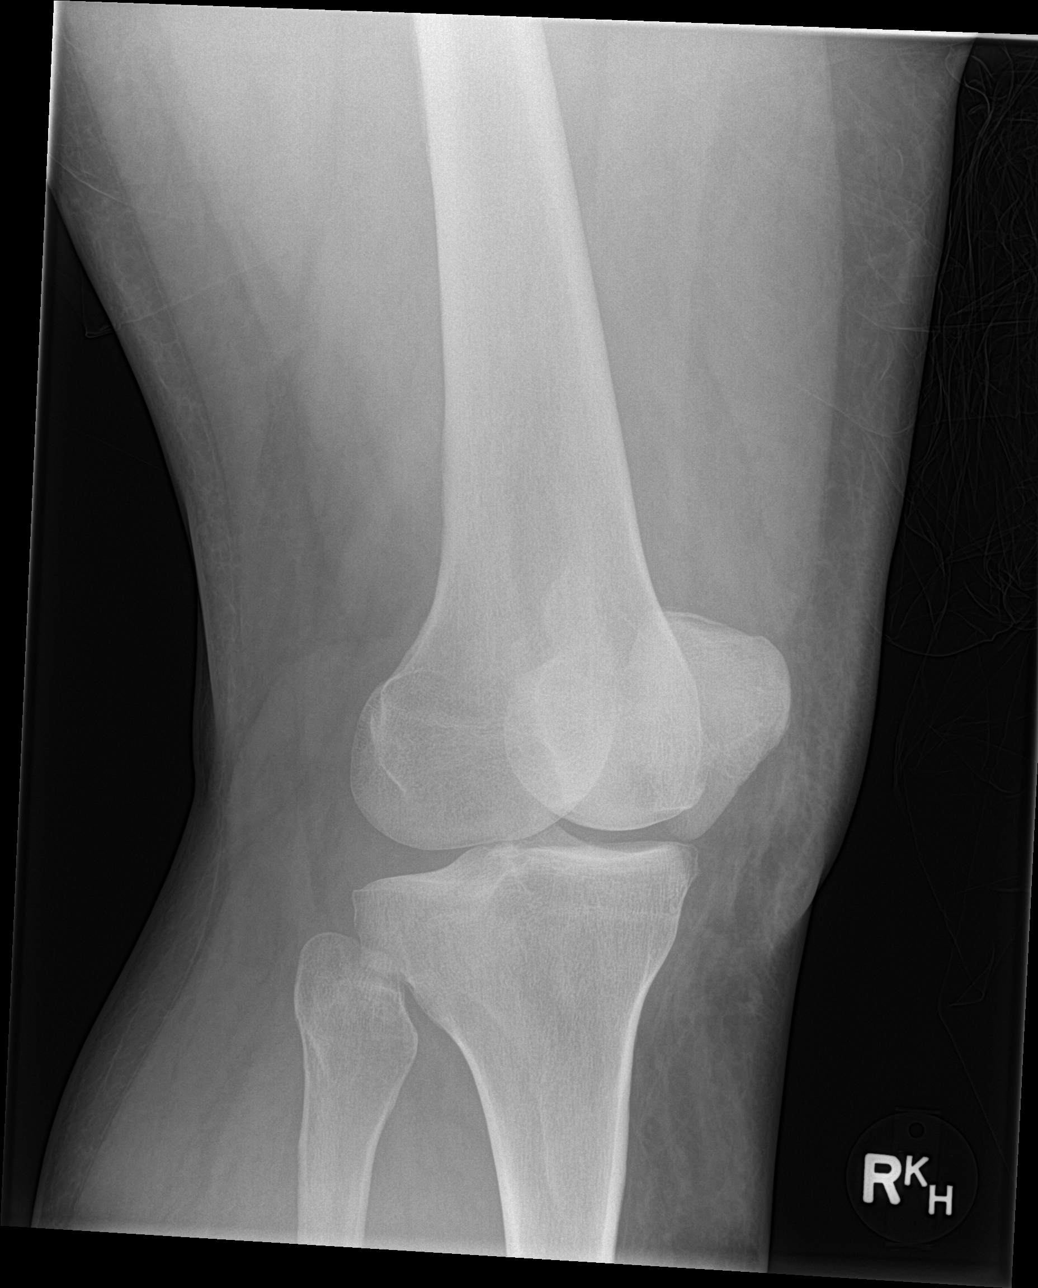

[knee lat]
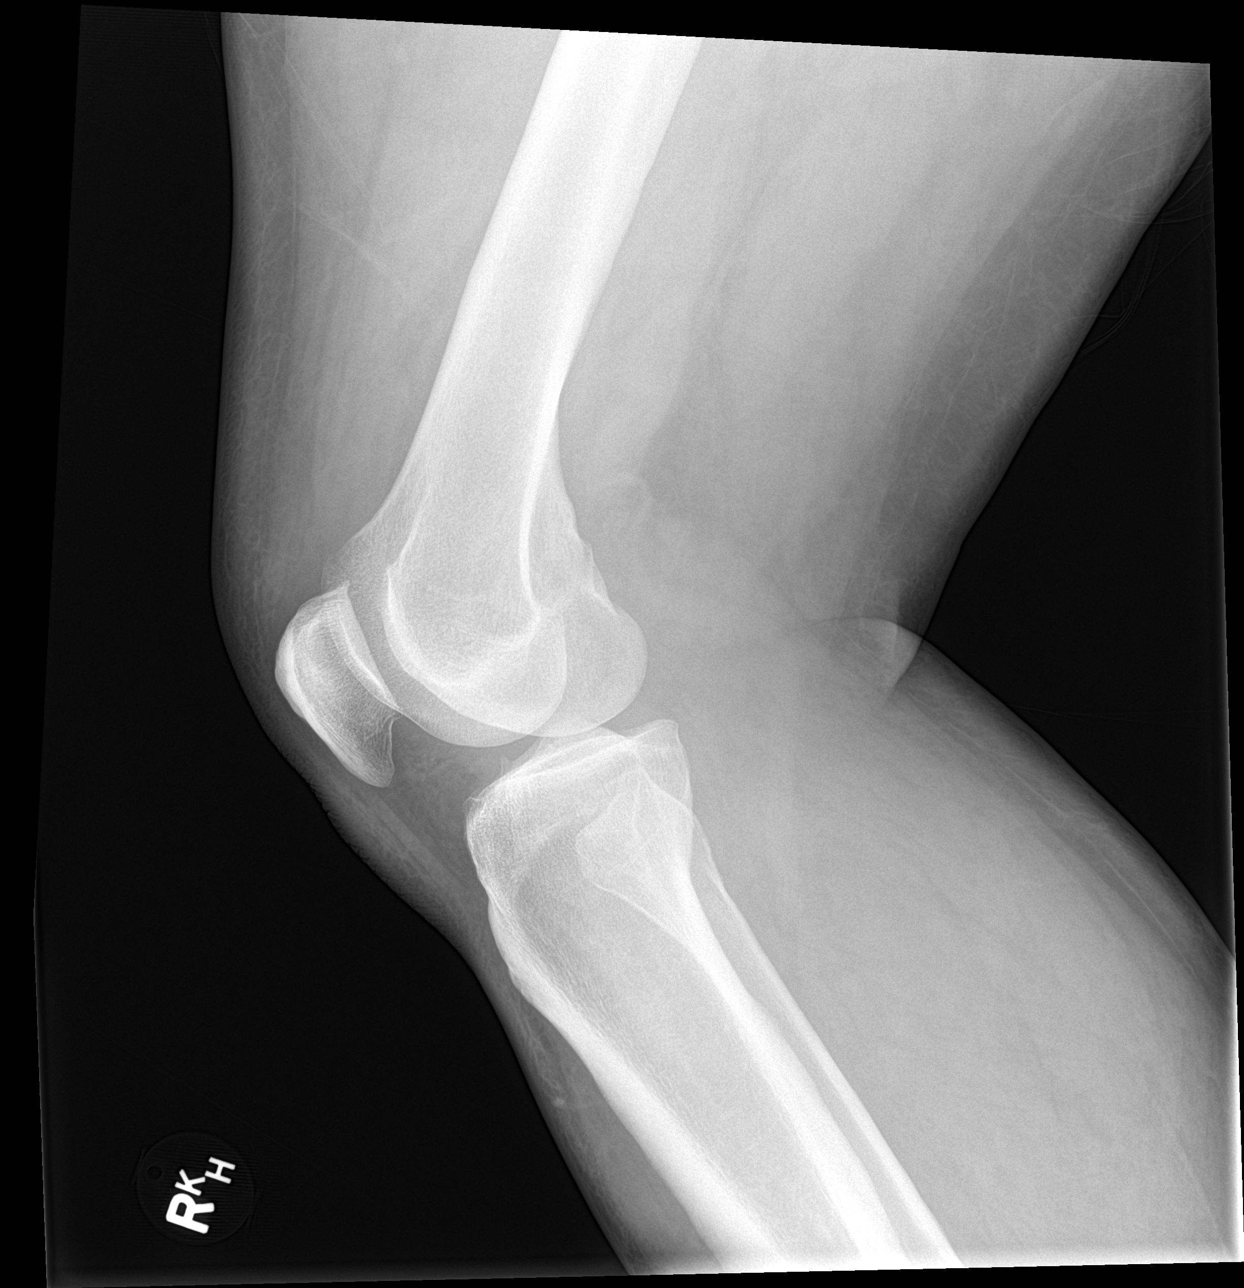

[knee obl (1 of 2)]
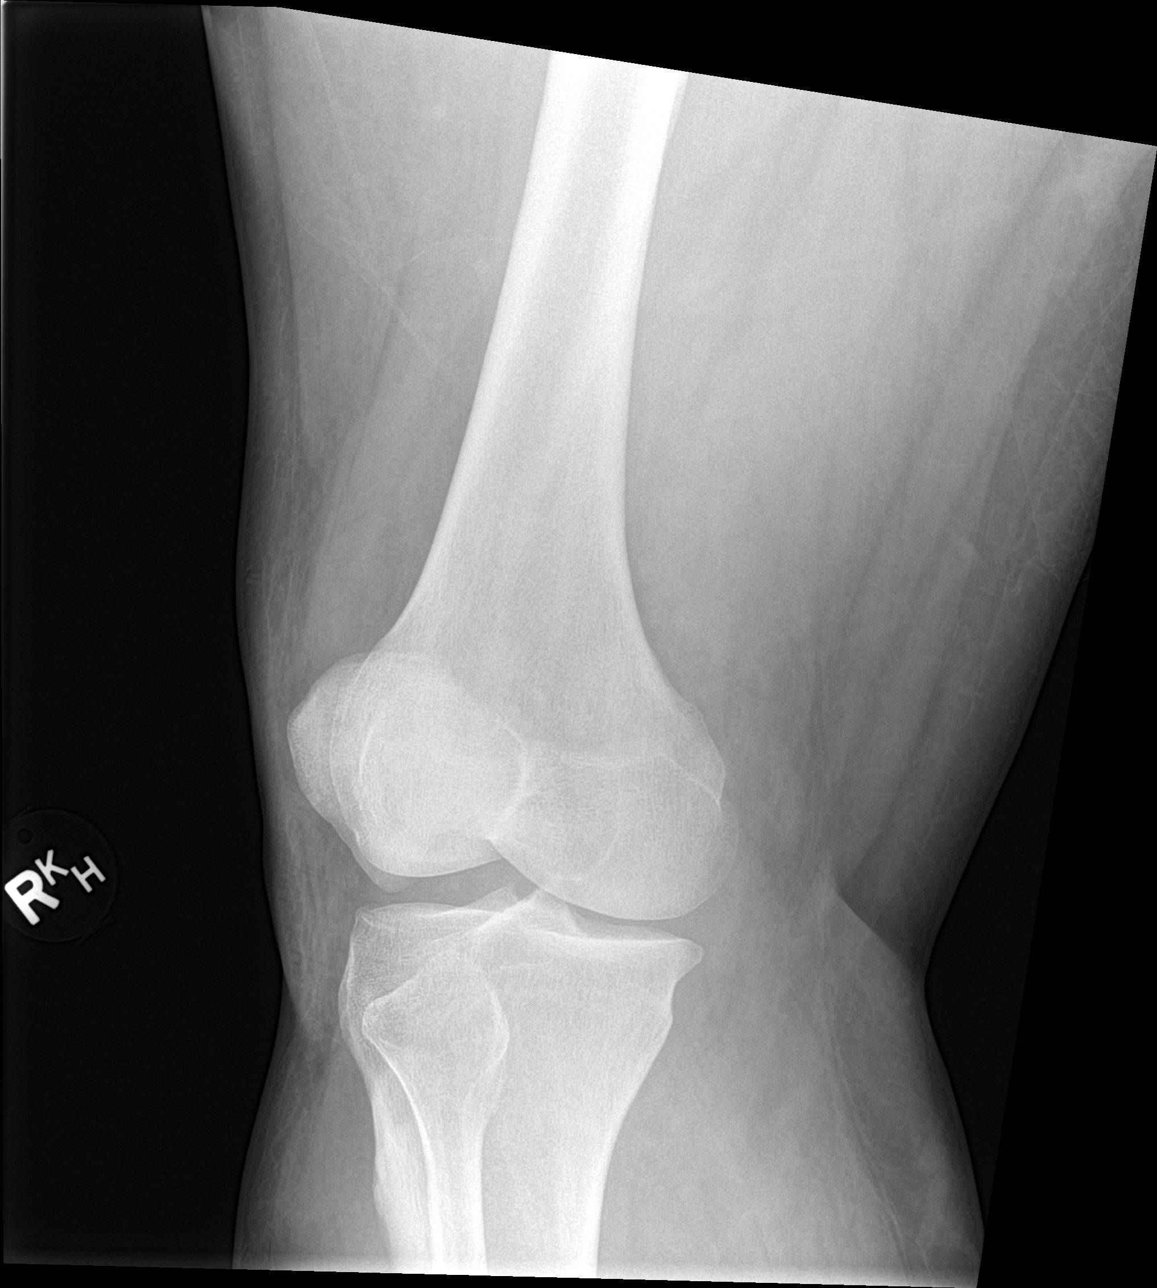

[knee obl (2 of 2)]
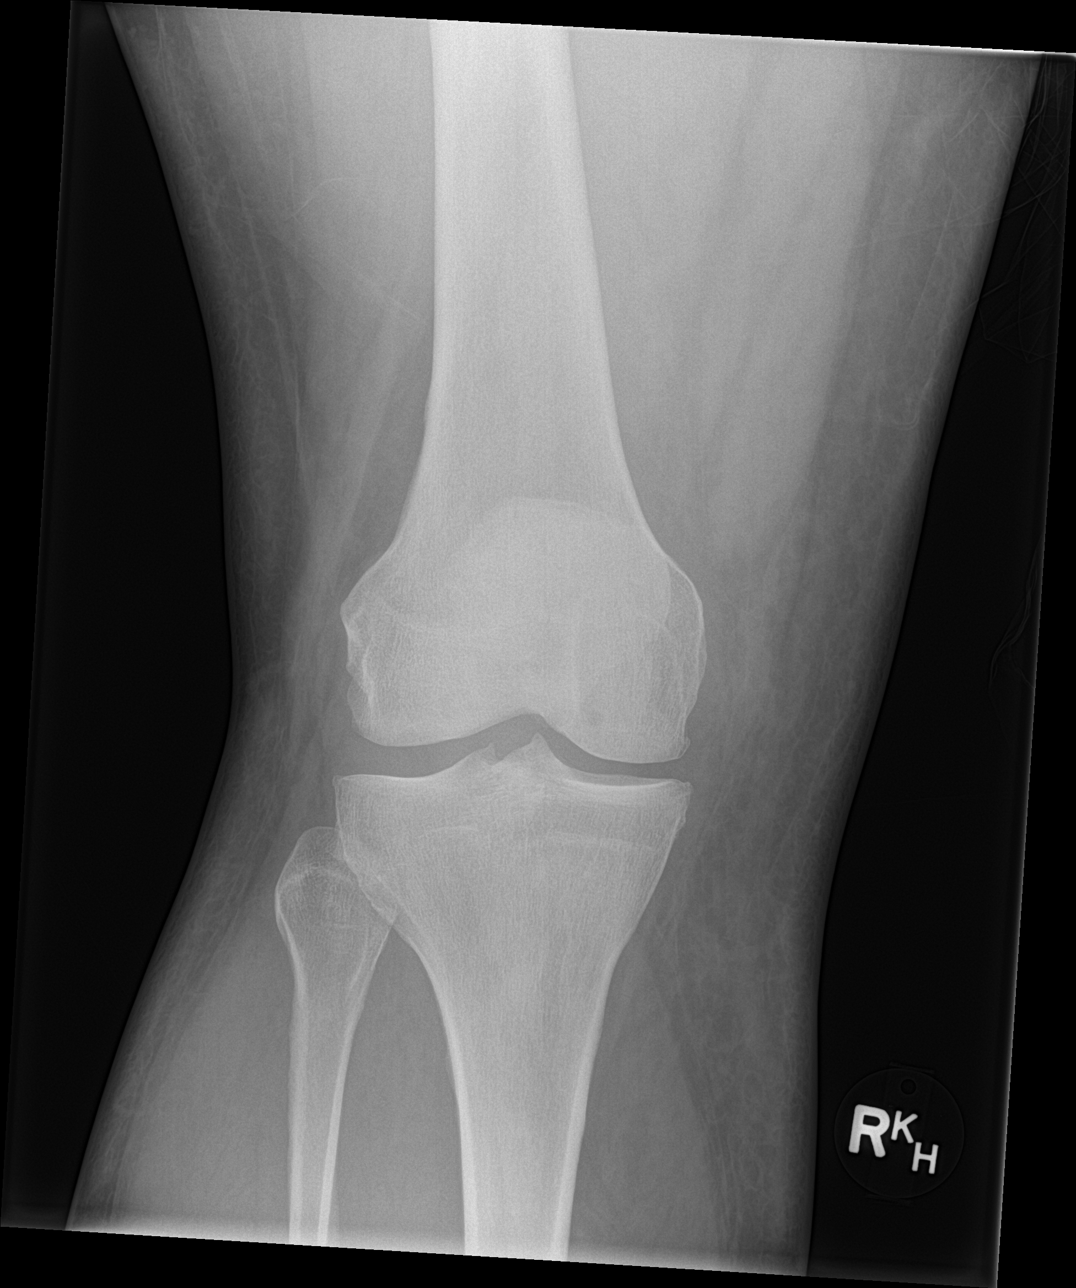

[4 of 4 positions shown; findings below may reference images not displayed]

FINDINGS: No evidence of fracture, dislocation, or joint effusion. Mild medial
compartment joint space narrowing. Mild tricompartmental spurring.
Soft tissues are unremarkable.
IMPRESSION: No acute osseous abnormality in the right knee.

## 2024-04-22 ENCOUNTER — Other Ambulatory Visit: Payer: Self-pay

## 2024-04-22 ENCOUNTER — Encounter (HOSPITAL_BASED_OUTPATIENT_CLINIC_OR_DEPARTMENT_OTHER): Payer: Self-pay | Admitting: Emergency Medicine

## 2024-04-22 ENCOUNTER — Emergency Department (HOSPITAL_BASED_OUTPATIENT_CLINIC_OR_DEPARTMENT_OTHER)
Admission: EM | Admit: 2024-04-22 | Discharge: 2024-04-22 | Disposition: A | Discharge status: Home / Self Care | Attending: Emergency Medicine | Admitting: Emergency Medicine

## 2024-04-22 DIAGNOSIS — S61210A Laceration without foreign body of right index finger without damage to nail, initial encounter: Secondary | ICD-10-CM | POA: Insufficient documentation

## 2024-04-22 DIAGNOSIS — W268XXA Contact with other sharp object(s), not elsewhere classified, initial encounter: Secondary | ICD-10-CM | POA: Diagnosis not present

## 2024-04-22 DIAGNOSIS — Z23 Encounter for immunization: Secondary | ICD-10-CM | POA: Insufficient documentation

## 2024-04-22 DIAGNOSIS — S6991XA Unspecified injury of right wrist, hand and finger(s), initial encounter: Secondary | ICD-10-CM | POA: Diagnosis present

## 2024-04-22 MED ORDER — BUPIVACAINE HCL (PF) 0.5 % IJ SOLN
10.0000 mL | Freq: Once | INTRAMUSCULAR | Status: AC
  Administered 2024-04-22: 10 mL
  Filled 2024-04-22: qty 10

## 2024-04-22 MED ORDER — TETANUS-DIPHTH-ACELL PERTUSSIS 5-2.5-18.5 LF-MCG/0.5 IM SUSY
0.5000 mL | PREFILLED_SYRINGE | Freq: Once | INTRAMUSCULAR | Status: AC
  Administered 2024-04-22: 0.5 mL via INTRAMUSCULAR
  Filled 2024-04-22: qty 0.5

## 2024-04-22 NOTE — ED Triage Notes (Signed)
 Pt POV reports reaching into toolbox and cutting R index finger on unsecured razor. Unsure as to tetanus vacc status. Denies blood thinners. Bleeding controlled at time of triage.

## 2024-04-22 NOTE — ED Provider Notes (Signed)
 [facility] EMERGENCY DEPARTMENT AT [facility] Provider Note   CSN: [id number] Arrival date & time: 04/22/24  1134     Patient presents with: Laceration   Larry Robbins is a 48 y.o. male.    Laceration  Patient is a 48 year old male present emergency room today with complaints of laceration to right index finger.  States is occurred approximately 1 hour prior to arrival in emergency department.  States he has constant achy sharp pain in the right index finger where laceration occurred.  Uncertain of tetanus vaccine status however I do see that he is due for 1 in 2026.  Will go ahead and update today.     Prior to Admission medications   Medication Sig Start Date End Date Taking? Authorizing Provider  asenapine (SAPHRIS) 5 MG SUBL 24 hr tablet Place 5 mg under the tongue 2 (two) times daily.    [provider]  clonazePAM (KLONOPIN) 1 MG tablet Take 1 mg by mouth 3 (three) times daily.    [provider]  EPINEPHrine  0.3 mg/0.3 mL IJ SOAJ injection Inject 0.3 mLs (0.3 mg total) into the muscle once as needed (Difficulty breathing, facial swelling, dizziness). 05/28/16   Carlyle Lenis, MD  gabapentin (NEURONTIN) 800 MG tablet Take 800 mg by mouth 3 (three) times daily.    [provider]  hydrOXYzine  (ATARAX /VISTARIL ) 25 MG tablet Take 1 tablet (25 mg total) by mouth every 6 (six) hours. 05/23/15   Sofia, Leslie K, PA-C  lithium carbonate 300 MG capsule Take 300 mg by mouth 2 (two) times daily with a meal.    [provider]  ondansetron  (ZOFRAN ) 4 MG tablet Take 1 tablet (4 mg total) by mouth every 6 (six) hours. 09/17/14   Haze Lonni PARAS, MD  OXCARBAZEPINE PO Take by mouth.    [provider]  predniSONE  (DELTASONE ) 20 MG tablet 3 tabs po day one, then 2 po daily x 4 days 05/28/16   Carlyle Lenis, MD    Allergies: Flumist [influenza virus vaccine] and Penicillins    Review of Systems  Updated Vital Signs BP  119/71   Pulse 87   Temp 98.3 F (36.8 C)   Resp 15   Ht 5' 11 (1.803 m)   Wt (!) 154.2 kg   SpO2 97%   BMI 47.42 kg/m   Physical Exam Vitals and nursing note reviewed.  Constitutional:      General: He is not in acute distress.    Appearance: Normal appearance. He is not ill-appearing.  HENT:     Head: Normocephalic and atraumatic.     Mouth/Throat:     Mouth: Mucous membranes are moist.   Eyes:     General: No scleral icterus.       Right eye: No discharge.        Left eye: No discharge.     Conjunctiva/sclera: Conjunctivae normal.    Cardiovascular:     Pulses: Normal pulses.     Comments: Radial artery pulses normal Pulmonary:     Effort: Pulmonary effort is normal.     Breath sounds: No stridor.   Skin:    General: Skin is warm and dry.     Comments: Approximately 2.5 cm laceration that is pictured below.  This has sheared some of the pad of the finger away from the finger.  No evidence of bony injury.  This does not overlie any joint.  No nail injury.  Somewhat dusky appearance of the skin  that has been lacerated.   Neurological:     Mental Status: He is alert and oriented to person, place, and time. Mental status is at baseline.     Comments: Sensation intact in fingertip, decree sensation in laceration skin flap.  Psychiatric:        Mood and Affect: Mood normal.        Behavior: Behavior normal.        (all labs ordered are listed, but only abnormal results are displayed) Labs Reviewed - No data to display  EKG: None  Radiology: No results found.   .Laceration Repair  Date/Time: 04/22/2024 3:38 PM  Performed by: Neldon Hamp RAMAN, PA Authorized by: Neldon Hamp RAMAN, PA   Consent:    Consent obtained:  Verbal   Consent given by:  Parent and patient   Risks discussed:  Infection, pain, poor cosmetic result and poor wound healing Universal protocol:    Procedure explained and questions answered to patient or proxy's satisfaction: yes      Relevant documents present and verified: yes     Test results available: yes     Imaging studies available: yes     Required blood products, implants, devices, and special equipment available: yes     Site/side marked: yes     Immediately prior to procedure, a time out was called: yes     Patient identity confirmed:  Verbally with patient and arm band Anesthesia:    Anesthesia method:  Nerve block   Block location:  Digital block right index finger   Block needle gauge:  27 G   Block anesthetic:  Bupivacaine 0.5% w/o epi   Block technique:  Digital block Laceration details:    Length (cm):  2 Exploration:    Hemostasis achieved with:  Direct pressure   Imaging outcome: foreign body not noted     Wound extent: areolar tissue violated     Wound extent: fascia not violated, no foreign body and no signs of injury     Contaminated: no   Treatment:    Amount of cleaning:  Standard   Irrigation solution:  Sterile saline   Irrigation volume:  Copious   Visualized foreign bodies/material removed: no   Skin repair:    Repair method:  Sutures   Suture size:  5-0   Suture material:  Nylon   Suture technique:  Simple interrupted (3x simple interrupted and 1x horizontal stitch placed to tac skin lap while suturing)   Number of sutures:  4 Approximation:    Approximation:  Close Repair type:    Repair type:  Intermediate Post-procedure details:    Dressing:  Antibiotic ointment   Procedure completion:  Tolerated    Medications Ordered in the ED  bupivacaine(PF) (MARCAINE) 0.5 % injection 10 mL (10 mLs Infiltration Given 04/22/24 1345)  Tdap (BOOSTRIX) injection 0.5 mL (0.5 mLs Intramuscular Given 04/22/24 1542)                                    Medical Decision Making Risk Prescription drug management.    Patient is a 48 year old male present emergency room today with complaints of laceration to right index finger.  States is occurred approximately 1 hour prior to arrival in  emergency department.  States he has constant achy sharp pain in the right index finger where laceration occurred.  Uncertain of tetanus vaccine status however I do see that he  is due for 1 in 2026.  Will go ahead and update today.  Wound was repaired with 4 stitches.  Finger has full range of motion, patient updated on tetanus as his last tetanus vaccine was 9 years ago.  No indication for antibiotic prophylaxis.  Patient's wound was dressed with antibiotic ointment and bandage by me.  Will follow-up for suture removal.  Will discharge home at this time.  Return precautions discussed  Final diagnoses:  Laceration of right index finger without foreign body without damage to nail, initial encounter    ED Discharge Orders     None          Neldon Hamp RAMAN, [place] 04/22/24 LEAFY Freddi Hamilton, MD 04/23/24 [phone]

## 2024-04-22 NOTE — Discharge Instructions (Addendum)
 Keep the wound clean with warm soap and water dab dry and apply bacitracin ointment to keep the area clean. Keep it covered prevent any dirt from entering the wound.  I written you a work note for tomorrow off and then an additional work note saying you will need to be excused from work that requires handling dirty objects/using your hands  You had your tetanus vaccine updated today.  This is good for 10 years.  You will need to have your stitches removed in 7 days. This can occur here or at urgent care/primary care.

## 2024-05-24 ENCOUNTER — Encounter (HOSPITAL_BASED_OUTPATIENT_CLINIC_OR_DEPARTMENT_OTHER): Payer: Self-pay | Admitting: Emergency Medicine

## 2024-05-24 ENCOUNTER — Emergency Department (HOSPITAL_BASED_OUTPATIENT_CLINIC_OR_DEPARTMENT_OTHER)
Admission: EM | Admit: 2024-05-24 | Discharge: 2024-05-24 | Disposition: A | Discharge status: Home / Self Care | Attending: Emergency Medicine | Admitting: Emergency Medicine

## 2024-05-24 ENCOUNTER — Other Ambulatory Visit: Payer: Self-pay

## 2024-05-24 DIAGNOSIS — T7840XA Allergy, unspecified, initial encounter: Secondary | ICD-10-CM | POA: Diagnosis present

## 2024-05-24 DIAGNOSIS — F419 Anxiety disorder, unspecified: Secondary | ICD-10-CM | POA: Insufficient documentation

## 2024-05-24 MED ORDER — METHYLPREDNISOLONE SODIUM SUCC 125 MG IJ SOLR
125.0000 mg | Freq: Once | INTRAMUSCULAR | Status: AC
  Administered 2024-05-24: 125 mg via INTRAVENOUS
  Filled 2024-05-24: qty 2

## 2024-05-24 MED ORDER — EPINEPHRINE 0.3 MG/0.3ML IJ SOAJ: 0.3000 mg | INTRAMUSCULAR | 0 refills | Status: AC | PRN

## 2024-05-24 MED ORDER — DIPHENHYDRAMINE HCL 50 MG/ML IJ SOLN
25.0000 mg | Freq: Once | INTRAMUSCULAR | Status: AC
  Administered 2024-05-24: 25 mg via INTRAVENOUS
  Filled 2024-05-24: qty 1

## 2024-05-24 MED ORDER — FAMOTIDINE IN NACL 20-0.9 MG/50ML-% IV SOLN
20.0000 mg | Freq: Once | INTRAVENOUS | Status: AC
  Administered 2024-05-24: 20 mg via INTRAVENOUS
  Filled 2024-05-24: qty 50

## 2024-05-24 MED ORDER — SODIUM CHLORIDE 0.9 % IV BOLUS
1000.0000 mL | Freq: Once | INTRAVENOUS | Status: AC
  Administered 2024-05-24: 1000 mL via INTRAVENOUS

## 2024-05-24 NOTE — Discharge Instructions (Addendum)
 Allergic reaction What are the causes? This type of reaction happens when you are exposed to something you are allergic to (allergen). Your body makes antibodies to fight the allergen. It also makes a compound called histamine. This causes parts of your body to swell. It can also cause loss of blood pressure to areas like the heart and lungs. Allergens that can cause this type of reaction include: Foods, such as peanuts, wheat, shellfish, milk, or eggs. Medicines. Insect bites or stings. Blood or parts of blood received for treatment (transfusions). Chemicals. These include dyes, latex, and the contrast material used for medical tests. What increases the risk? You are more likely to have this kind of reaction if: You have allergies. You have had an anaphylactic reaction before. You have a family history of anaphylactic reaction. You have certain conditions. These include asthma and eczema. What are the signs or symptoms? Symptoms of this condition may include: Feeling warm in the face (flushed). Your face may turn red. Hives. These are itchy, red, swollen areas of skin. Swelling of the eyes, lips, face, mouth, tongue, or throat. Trouble breathing, speaking, or swallowing. Making high-pitched whistling sounds when you breathe, most often when you breathe out (wheezing). Feeling dizzy or light-headed, or fainting. Pain or cramping in the abdomen. Vomiting or diarrhea. How is this diagnosed? This condition is diagnosed based on: Your symptoms. A physical exam. Blood tests. When you were last exposed to an allergen. How is this treated? If you think you are having an anaphylactic reaction, you should: Give yourself an emergency shot of epinephrine  using a device filled with medicine (auto-injector pen). Your health care provider will teach you how to use the auto-injector pen. Call for help. If you use an auto-injector pen, you must still get treated in the hospital. You may be  given: Medicines to help: Tighten your blood vessels (epinephrine ). Relieve itching and hives (antihistamines). Reduce swelling (corticosteroids). Oxygen therapy to help you breathe. IV fluids to keep enough water in your body. Follow these instructions at home: Safety Always keep an auto-injector pen near you. Use it as told by your provider. Do not drive after you have an anaphylactic reaction. Wait until your provider says that you can. Make sure that you, the people in your household, and your employer know: What you are allergic to. How to use an auto-injector pen. Replace the epinephrine  right after you use your auto-injector pen. If you can, carry two pens. If told by your provider, wear an alert bracelet or necklace that states your allergy. Learn the signs of anaphylactic reaction. Work with your health care team to make a plan for how to handle an anaphylactic reaction. General instructions Take over-the-counter and prescription medicines only as told by your provider. If you have hives or a rash: Use an over-the-counter allergy medicine (antihistamine) as told by your provider. Apply cold, wet cloths (cold compresses) to your skin. Take baths or showers in cool water. Do not use hot water. Tell your team that you have an allergy. How is this prevented? Avoid allergens. When you are at a restaurant, tell your server that you have an allergy. If you are not sure if a menu item contains a food that you are allergic to, ask your server. Where to find more information American Academy of Allergy, Asthma, and Immunology (AAAAI): aaaai.org Contact a health care provider if: Your auto-injector pen has expired. Get help right away if: You have symptoms of an allergic reaction. You use an auto-injector  pen. You will need more medical care even if the medicine seems to be working. An anaphylactic reaction may happen again within 72 hours (rebound anaphylaxis). These symptoms may be  an emergency. Use your auto-injector pen right away. Then call 911. Do not wait to see if the symptoms will go away. Do not drive yourself to the hospital. This information is not intended to replace advice given to you by your health care provider. Make sure you discuss any questions you have with your health care provider.

## 2024-05-24 NOTE — ED Notes (Signed)
 ED Provider at bedside.

## 2024-05-24 NOTE — ED Provider Notes (Signed)
 [place] [facility] AT [facility] [place] Provider Note   CSN: [id number] Arrival date & time: 05/24/24  1642     Patient presents with: Allergic Reaction   Larry Robbins is a 48 y.o. male who  has a past medical history of Bipolar 1 disorder (HCC), Concussion, Eating disorder, Lumbar herniated disc, PTSD (post-traumatic stress disorder), and TBI (traumatic brain injury) (HCC). Patient reports that he was cleaning out a trailer full of trash that had been there for a long time when one of the bags opened and bugs swarmed him.  He states he does not know what kind of bugs they were because he is vision impaired but did receive some bites.  He denies stings.  He complains of itching at the bite sites.  Ports that he went home took a shower tried to put vinegar on his bites but then started feeling burning and itching in his throat and felt like it was hard to breathe so came in.  He denies nausea abdominal pain vomiting loss of consciousness.  He states he has had to have EpiPen  in the past but states that his allergies are predominantly to cat hair and bees.    Allergic Reaction      Prior to Admission medications   Medication Sig Start Date End Date Taking? Authorizing Provider  asenapine (SAPHRIS) 5 MG SUBL 24 hr tablet Place 5 mg under the tongue 2 (two) times daily.    [provider]  clonazePAM (KLONOPIN) 1 MG tablet Take 1 mg by mouth 3 (three) times daily.    [provider]  EPINEPHrine  0.3 mg/0.3 mL IJ SOAJ injection Inject 0.3 mLs (0.3 mg total) into the muscle once as needed (Difficulty breathing, facial swelling, dizziness). 05/28/16   Carlyle Lenis, MD  gabapentin (NEURONTIN) 800 MG tablet Take 800 mg by mouth 3 (three) times daily.    [provider]  hydrOXYzine  (ATARAX /VISTARIL ) 25 MG tablet Take 1 tablet (25 mg total) by mouth every 6 (six) hours. 05/23/15   Sofia, Leslie K, PA-C  lithium carbonate 300 MG capsule Take 300 mg by  mouth 2 (two) times daily with a meal.    [provider]  ondansetron  (ZOFRAN ) 4 MG tablet Take 1 tablet (4 mg total) by mouth every 6 (six) hours. 09/17/14   Haze Lonni PARAS, MD  OXCARBAZEPINE PO Take by mouth.    [provider]  predniSONE  (DELTASONE ) 20 MG tablet 3 tabs po day one, then 2 po daily x 4 days 05/28/16   Carlyle Lenis, MD    Allergies: Bee venom, Penicillins, Aripiprazole, Cat dander, Cat hair extract, Divalproex sodium, Fluoxetine, Haemophilus b polysaccharide vaccine, Influenza virus vaccine, Paroxetine, Penicillin g benzathine & proc, Procaine, Quetiapine, Quetiapine fumarate, Sertraline, Sertraline hcl, Valproic acid, Divalproex sodium er, and Influenza vac split quad    Review of Systems  Updated Vital Signs BP (!) 126/96 (BP Location: Left Arm)   Pulse 93   Temp (!) 97.5 F (36.4 C) (Oral)   Resp (!) 22   Ht 5' 11 (1.803 m)   Wt (!) 145.2 kg   SpO2 98%   BMI 44.63 kg/m   Physical Exam Vitals and nursing note reviewed.  Constitutional:      General: He is not in acute distress.    Appearance: He is well-developed. He is not diaphoretic.  HENT:     Head: Normocephalic and atraumatic.     Mouth/Throat:     Mouth: Mucous membranes are moist.  Eyes:     General: No scleral icterus.    Extraocular Movements: Extraocular movements intact.     Conjunctiva/sclera: Conjunctivae normal.     Pupils: Pupils are equal, round, and reactive to light.  Cardiovascular:     Rate and Rhythm: Normal rate and regular rhythm.     Heart sounds: Normal heart sounds.  Pulmonary:     Effort: Pulmonary effort is normal. No tachypnea or respiratory distress.     Breath sounds: Normal breath sounds. No decreased air movement or transmitted upper airway sounds. No decreased breath sounds, wheezing, rhonchi or rales.     Comments: No stridor Abdominal:     Palpations: Abdomen is soft.     Tenderness: There is no abdominal tenderness.  Musculoskeletal:      Cervical back: Normal range of motion and neck supple.  Skin:    General: Skin is warm and dry.     Comments: Several small bug bite on abdomen, legs neck, no hives  Neurological:     Mental Status: He is alert.  Psychiatric:        Mood and Affect: Mood is anxious.        Behavior: Behavior normal.     (all labs ordered are listed, but only abnormal results are displayed) Labs Reviewed - No data to display  EKG: None  Radiology: No results found.   Procedures   Medications Ordered in the ED  sodium chloride  0.9 % bolus 1,000 mL (has no administration in time range)  famotidine  (PEPCID ) IVPB 20 mg premix (has no administration in time range)  methylPREDNISolone  sodium succinate (SOLU-MEDROL ) 125 mg/2 mL injection 125 mg (has no administration in time range)  diphenhydrAMINE  (BENADRYL ) injection 25 mg (has no administration in time range)                                    Medical Decision Making Risk Prescription drug management.   Patient here with c/o of allergic reaction. Differential diagnosis includes, drug eruption, bug bites, nonspecific rash, scabies, anaphylaxis, anxiety. On exam- rash is consistent with bug bites as they all have a central erythematous punctum consistent with a bite.   ON exam patient has no hives, wheezing, vomiting, abdominal pain, stridor coughing, facial swelling, signs of angioedema, or hypotension. He does seem quite anxious Tx: .meds- solumedrol, benadryl  and pepcid  Sxs resolved, patient feeling better  D/c with epipen - forego steroids due to sig hx of bipolar disorder And low suspicion for anaphylaxis or anaphylactoid reaction  Discussed OP f/u and return precautions    Final diagnoses:  None    ED Discharge Orders     None          Arloa Chroman, PA-C 05/27/24 9161    Dreama Longs, MD 05/27/24 1225

## 2024-05-24 NOTE — ED Triage Notes (Addendum)
 Patient reports he was moving a bag of trash, the bag broke open and he was swarmed by an unknown bug. Welts present on neck and spreading down trunk. Patient also c/o shob and throat tightness. Patient coughing. Appears anxious. But also drinking mt dew during triage. Denies meds pta.

## 2024-06-20 ENCOUNTER — Other Ambulatory Visit (HOSPITAL_COMMUNITY): Payer: Self-pay

## 2024-11-13 ENCOUNTER — Emergency Department (HOSPITAL_COMMUNITY)
Admission: EM | Admit: 2024-11-13 | Discharge: 2024-11-14 | Disposition: A | Discharge status: Home / Self Care | Attending: Emergency Medicine | Admitting: Emergency Medicine

## 2024-11-13 ENCOUNTER — Emergency Department (HOSPITAL_COMMUNITY)

## 2024-11-13 ENCOUNTER — Encounter (HOSPITAL_COMMUNITY): Payer: Self-pay | Admitting: Emergency Medicine

## 2024-11-13 DIAGNOSIS — R519 Headache, unspecified: Secondary | ICD-10-CM | POA: Insufficient documentation

## 2024-11-13 DIAGNOSIS — M542 Cervicalgia: Secondary | ICD-10-CM | POA: Diagnosis present

## 2024-11-13 DIAGNOSIS — Y92410 Unspecified street and highway as the place of occurrence of the external cause: Secondary | ICD-10-CM | POA: Insufficient documentation

## 2024-11-13 NOTE — ED Notes (Signed)
"  C-collar placed in triage   "
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
## 2024-11-14 MED ORDER — MELOXICAM 7.5 MG PO TABS: 7.5000 mg | ORAL_TABLET | Freq: Every day | ORAL | 0 refills | Status: AC

## 2024-11-14 MED ORDER — NAPROXEN 250 MG PO TABS
500.0000 mg | ORAL_TABLET | Freq: Once | ORAL | Status: AC
  Administered 2024-11-14: 500 mg via ORAL
  Filled 2024-11-14: qty 2

## 2024-11-14 MED ORDER — LIDOCAINE 5 % EX PTCH
1.0000 | MEDICATED_PATCH | CUTANEOUS | Status: DC
  Administered 2024-11-14: 1 via TRANSDERMAL
  Filled 2024-11-14: qty 1

## 2024-11-14 MED ORDER — METHOCARBAMOL 500 MG PO TABS: 500.0000 mg | ORAL_TABLET | Freq: Two times a day (BID) | ORAL | 0 refills | Status: AC
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
